# Patient Record
Sex: Female | Born: 1985 | Race: White | Hispanic: No | Marital: Married | Smoking: Never smoker
Health system: Southern US, Community
[De-identification: ages and names within clinical notes are randomized; demographics above are authoritative.]

## PROBLEM LIST (undated history)

## (undated) DIAGNOSIS — E119 Type 2 diabetes mellitus without complications: Secondary | ICD-10-CM

## (undated) DIAGNOSIS — N390 Urinary tract infection, site not specified: Secondary | ICD-10-CM

## (undated) DIAGNOSIS — D649 Anemia, unspecified: Secondary | ICD-10-CM

---

## 2012-09-13 ENCOUNTER — Emergency Department (INDEPENDENT_AMBULATORY_CARE_PROVIDER_SITE_OTHER)
Admission: EM | Admit: 2012-09-13 | Discharge: 2012-09-13 | Disposition: A | Discharge status: Home / Self Care | Payer: Medicaid Other | Source: Home / Self Care

## 2012-09-13 ENCOUNTER — Encounter (HOSPITAL_COMMUNITY): Payer: Self-pay | Admitting: Emergency Medicine

## 2012-09-13 DIAGNOSIS — E119 Type 2 diabetes mellitus without complications: Secondary | ICD-10-CM

## 2012-09-13 HISTORY — DX: Type 2 diabetes mellitus without complications: E11.9

## 2012-09-13 LAB — POCT I-STAT, CHEM 8
Calcium, Ion: 1.26 mmol/L — ABNORMAL HIGH (ref 1.12–1.23)
Chloride: 104 mEq/L (ref 96–112)
Glucose, Bld: 268 mg/dL — ABNORMAL HIGH (ref 70–99)
HCT: 31 % — ABNORMAL LOW (ref 36.0–46.0)
Hemoglobin: 10.5 g/dL — ABNORMAL LOW (ref 12.0–15.0)
Potassium: 4 mEq/L (ref 3.5–5.1)

## 2012-09-13 LAB — POCT URINALYSIS DIP (DEVICE)
Glucose, UA: >=1000 mg/dL — AB
Ketones, ur: NEGATIVE mg/dL
Leukocytes, UA: NEGATIVE
Protein, ur: NEGATIVE mg/dL
Specific Gravity, Urine: 1.02 (ref 1.005–1.030)
Urobilinogen, UA: 0.2 mg/dL (ref 0.0–1.0)

## 2012-09-13 MED ORDER — INSULIN NPH ISOPHANE & REGULAR (70-30) 100 UNIT/ML ~~LOC~~ SUSP: SUBCUTANEOUS | Status: DC

## 2012-09-13 MED ORDER — INSULIN REGULAR HUMAN 100 UNIT/ML IJ SOLN: INTRAMUSCULAR | Status: DC

## 2012-09-13 NOTE — ED Notes (Signed)
Dr. Denyse Amass gave written Rx's for a Glucometer to use TID and another for 30 gage needles and syringes... Placed in bin to be scanned in.

## 2012-09-13 NOTE — ED Provider Notes (Signed)
Taylor Warren is a 27 y.o. female who presents to Urgent Care today for diabetes. Patient is a refugee just arriving to the [place] from [place]. She's had type 1 diabetes for several years now as previously well controlled with Mixtard 30,  15 units AM and Lunch and 20 units PM, and sliding scale regular insulin. She notes that her glucometer [place] at the airport.  She denies any very high glucose levels or hypoglycemic events.  She does however note dysuria present for several months. She had one treatment with antibiotics which did not work.   No fevers or chills nausea vomiting or diarrhea.   Applying for Medicaid. She does not yet have a physician.    PMH reviewed. DM  History  Substance Use Topics  . Smoking status: Never Smoker   . Smokeless tobacco: Not on file  . Alcohol Use: No   ROS as above Medications reviewed. No current facility-administered medications for this encounter.   Current Outpatient Prescriptions  Medication Sig Dispense Refill  . insulin NPH-regular (NOVOLIN 70/30) (70-30) 100 UNIT/ML injection 15 units breakfast and lunch. 20 units [place] in evening.  10 mL  2  . insulin regular (HUMULIN R) 100 units/mL injection Sliding scale [place]  10 mL  1    Exam:  BP 113/76  Pulse 88  Temp(Src) 98.6 F (37 C) (Oral)  Resp 14  SpO2 100% Gen: Well NAD HEENT: EOMI,  MMM Lungs: CTABL Nl WOB Heart: RRR no MRG Abd: NABS, NT, ND Exts: Non edematous BL  LE, warm and well perfused.   Results for orders placed during the hospital encounter of 09/13/12 (from the past 24 hour(s))  POCT I-STAT, CHEM 8     Status: Abnormal   Collection Time    09/13/12 12:10 PM      Result Value Range   Sodium 140  135 - 145 mEq/L   Potassium 4.0  3.5 - 5.1 mEq/L   Chloride 104  96 - 112 mEq/L   BUN 17  6 - 23 mg/dL   Creatinine, Ser 8.65  0.50 - 1.10 mg/dL   Glucose, Bld 784 (*) 70 - 99 mg/dL   Calcium, Ion 6.96 (*) 1.12 - 1.23 mmol/L   TCO2 23  0 - 100 mmol/L   Hemoglobin 10.5 (*) 12.0 - 15.0 g/dL   HCT 29.5 (*) 28.4 - 13.2 %  POCT URINALYSIS DIP (DEVICE)     Status: Abnormal   Collection Time    09/13/12 12:45 PM      Result Value Range   Glucose, UA >=1000 (*) NEGATIVE mg/dL   Bilirubin Urine NEGATIVE  NEGATIVE   Ketones, ur NEGATIVE  NEGATIVE mg/dL   Specific Gravity, Urine 1.020  1.005 - 1.030   Hgb urine dipstick SMALL (*) NEGATIVE   pH 6.0  5.0 - 8.0   Protein, ur NEGATIVE  NEGATIVE mg/dL   Urobilinogen, UA 0.2  0.0 - 1.0 mg/dL   Nitrite NEGATIVE  NEGATIVE   Leukocytes, UA NEGATIVE  NEGATIVE   No results found.  Assessment and Plan: 27 y.o. female with Diabetes.  Controlled type 1 DM.  Nearest analog to Mixtard insulin is Novolin 70/30. Will use same dose but careful monitoring and tapering.  Regular insulin for SSI.  New rx for glucometer and strips, syringe and needles,   F/u with Dr. Letitia Libra ASAP for DM management.   Patient does not have evidence of UTI. Dysuria likely secondary to glycosuria.  Rodolph Bong, MD 09/13/12 [phone]

## 2012-09-13 NOTE — ED Notes (Signed)
Pt is here needing medications on her DM... Reports she just came to [place]; refugee... No PCP... Voices no other concerns...  She is alert w/no signs of acute distress.

## 2012-09-18 ENCOUNTER — Ambulatory Visit (HOSPITAL_COMMUNITY)
Admission: RE | Admit: 2012-09-18 | Discharge: 2012-09-18 | Disposition: A | Discharge status: Home / Self Care | Payer: Medicaid Other | Source: Ambulatory Visit | Attending: Internal Medicine | Admitting: Internal Medicine

## 2012-09-18 ENCOUNTER — Encounter: Payer: Self-pay | Admitting: Family Medicine

## 2012-09-18 ENCOUNTER — Ambulatory Visit: Payer: Medicaid Other | Attending: Family Medicine | Admitting: Internal Medicine

## 2012-09-18 VITALS — BP 113/76 | HR 88

## 2012-09-18 DIAGNOSIS — Z794 Long term (current) use of insulin: Secondary | ICD-10-CM | POA: Insufficient documentation

## 2012-09-18 DIAGNOSIS — M25519 Pain in unspecified shoulder: Secondary | ICD-10-CM | POA: Insufficient documentation

## 2012-09-18 DIAGNOSIS — E109 Type 1 diabetes mellitus without complications: Secondary | ICD-10-CM | POA: Insufficient documentation

## 2012-09-18 DIAGNOSIS — E119 Type 2 diabetes mellitus without complications: Secondary | ICD-10-CM

## 2012-09-18 DIAGNOSIS — M25512 Pain in left shoulder: Secondary | ICD-10-CM

## 2012-09-18 DIAGNOSIS — Z760 Encounter for issue of repeat prescription: Secondary | ICD-10-CM | POA: Insufficient documentation

## 2012-09-18 DIAGNOSIS — N390 Urinary tract infection, site not specified: Secondary | ICD-10-CM | POA: Insufficient documentation

## 2012-09-18 MED ORDER — TRAMADOL HCL 50 MG PO TABS: 50.0000 mg | ORAL_TABLET | Freq: Three times a day (TID) | ORAL | Status: DC | PRN

## 2012-09-18 MED ORDER — NITROFURANTOIN MONOHYD MACRO 100 MG PO CAPS: 100.0000 mg | ORAL_CAPSULE | Freq: Two times a day (BID) | ORAL | Status: DC

## 2012-09-18 MED ORDER — INSULIN REGULAR HUMAN 100 UNIT/ML IJ SOLN: INTRAMUSCULAR | Status: DC

## 2012-09-18 MED ORDER — INSULIN NPH ISOPHANE & REGULAR (70-30) 100 UNIT/ML ~~LOC~~ SUSP: SUBCUTANEOUS | Status: DC

## 2012-09-18 NOTE — Progress Notes (Addendum)
Patient ID: Taylor Warren, female   DOB: [birth year], 27 y.o.   MRN: [id number]  CC: Left shoulder pain, diabetes medication  HPI: 27 year old female with past medical history of type 1 diabetes who presented to our clinic with complaints of left shoulder pain as well as the need for diabetic medications. Patient is taking NovoLog 70/30 insulin mix in addition to sliding scale insulin and requires refills. Besides left shoulder pain which has been present for past few days she has no other complaints. No blurry vision, lightheadedness or headaches. No polydipsia. She does report increased urination and burning sensation on urination but no hematuria. No fever or chills. No abdominal pain, nausea or vomiting.  No Known Allergies Past Medical History  Diagnosis Date  . Diabetes mellitus without complication    No current outpatient prescriptions on file prior to visit.   No current facility-administered medications on file prior to visit.   Family history significant for hypertension in mother  History   Social History  . Marital Status: Married    Spouse Name: N/A    Number of Children: N/A  . Years of Education: N/A   Occupational History  . Not on file.   Social History Main Topics  . Smoking status: Never Smoker   . Smokeless tobacco: Not on file  . Alcohol Use: No  . Drug Use: No  . Sexually Active: Not on file   Other Topics Concern  . Not on file   Social History Narrative  . No narrative on file    Review of Systems  Constitutional: Negative for fever, chills, diaphoresis, activity change, appetite change and fatigue.  HENT: Negative for ear pain, nosebleeds, congestion, facial swelling, rhinorrhea, neck pain, neck stiffness and ear discharge.   Eyes: Negative for pain, discharge, redness, itching and visual disturbance.  Respiratory: Negative for cough, choking, chest tightness, shortness of breath, wheezing and stridor.   Cardiovascular: Negative for chest  pain, palpitations and leg swelling.  Gastrointestinal: Negative for abdominal distention.  Genitourinary: Positive for dysuria, urgency, frequency, negative for hematuria, flank pain, decreased urine volume, difficulty urinating and dyspareunia.  Musculoskeletal: Negative for back pain, joint swelling, arthralgias and gait problem.  positive for left shoulder pain Neurological: Negative for dizziness, tremors, seizures, syncope, facial asymmetry, speech difficulty, weakness, light-headedness, numbness and headaches.  Hematological: Negative for adenopathy. Does not bruise/bleed easily.  Psychiatric/Behavioral: Negative for hallucinations, behavioral problems, confusion, dysphoric mood, decreased concentration and agitation.    Objective:   Filed Vitals:   09/18/12 1205  BP: 113/76  Pulse: 88    Physical Exam  Constitutional: Appears well-developed and well-nourished. No distress.  HENT: Normocephalic. External right and left ear normal. Oropharynx is clear and moist.  Eyes: Conjunctivae and EOM are normal. PERRLA, no scleral icterus.  Neck: Normal ROM. Neck supple. No JVD. No tracheal deviation. No thyromegaly.  CVS: RRR, S1/S2 +, no murmurs, no gallops, no carotid bruit.  Pulmonary: Effort and breath sounds normal, no stridor, rhonchi, wheezes, rales.  Abdominal: Soft. BS +,  no distension, tenderness, rebound or guarding.  Musculoskeletal: Normal range of motion. No edema and no tenderness.  Lymphadenopathy: No lymphadenopathy noted, cervical, inguinal. Neuro: Alert. Normal reflexes, muscle tone coordination. No cranial nerve deficit. Skin: Skin is warm and dry. No rash noted. Not diaphoretic. No erythema. No pallor.  Psychiatric: Normal mood and affect. Behavior, judgment, thought content normal.   Lab Results  Component Value Date   HGB 10.5* 09/13/2012   HCT 31.0* 09/13/2012   Lab  Results  Component Value Date   CREATININE 0.60 09/13/2012   BUN 17 09/13/2012   NA 140  09/13/2012   K 4.0 09/13/2012   CL 104 09/13/2012    No results found for this basename: HGBA1C   Lipid Panel  No results found for this basename: chol, trig, hdl, cholhdl, vldl, ldlcalc       Assessment and plan:   Patient Active Problem List   Diagnosis Date Noted  . Type I (juvenile type) diabetes mellitus without mention of complication, not stated as uncontrolled - A1c during this visit, 9.4 indicating poor glycemic control  - Referral to endocrinology provided  - Continue NovoLog 70/30 mixed 15 units in the morning and 20 units in the evening in addition to sliding scale insulin  09/18/2012  . UTI (urinary tract infection) - Macrobid 100 mg twice daily for 5 days  09/18/2012  . Left shoulder pain - Prescription provided for Ultram when necessary  - We will obtain x-ray of the left shoulder  - Will check urine pregnancy test prior to sending the patient for x-ray (urine test negative for pregnancy) 09/18/2012

## 2012-09-18 NOTE — Patient Instructions (Addendum)
Blood Sugar Monitoring, Adult GLUCOSE METERS FOR SELF-MONITORING OF BLOOD GLUCOSE  It is important to be able to correctly measure your blood sugar (glucose). You can use a blood glucose monitor (a small battery-operated device) to check your glucose level at any time. This allows you and your caregiver to monitor your diabetes and to determine how well your treatment plan is working. The process of monitoring your blood glucose with a glucose meter is called self-monitoring of blood glucose (SMBG). When people with diabetes control their blood sugar, they have better health. To test for glucose with a typical glucose meter, place the disposable strip in the meter. Then place a small sample of blood on the "test strip." The test strip is coated with chemicals that combine with glucose in blood. The meter measures how much glucose is present. The meter displays the glucose level as a number. Several new models can record and store a number of test results. Some models can connect to personal computers to store test results or print them out.  Newer meters are often easier to use than older models. Some meters allow you to get blood from places other than your fingertip. Some new models have automatic timing, error codes, signals, or barcode readers to help with proper adjustment (calibration). Some meters have a large display screen or spoken instructions for people with visual impairments.  INSTRUCTIONS FOR USING GLUCOSE METERS  Wash your hands with soap and warm water, or clean the area with alcohol. Dry your hands completely.  Prick the side of your fingertip with a lancet (a sharp-pointed tool used by hand).  Hold the hand down and gently milk the finger until a small drop of blood appears. Catch the blood with the test strip.  Follow the instructions for inserting the test strip and using the SMBG meter. Most meters require the meter to be turned on and the test strip to be inserted before applying  the blood sample.  Record the test result.  Read the instructions carefully for both the meter and the test strips that go with it. Meter instructions are found in the user manual. Keep this manual to help you solve any problems that may arise. Many meters use "error codes" when there is a problem with the meter, the test strip, or the blood sample on the strip. You will need the manual to understand these error codes and fix the problem.  New devices are available such as laser lancets and meters that can test blood taken from "alternative sites" of the body, other than fingertips. However, you should use standard fingertip testing if your glucose changes rapidly. Also, use standard testing if:  You have eaten, exercised, or taken insulin in the past 2 hours.  You think your glucose is low.  You tend to not feel symptoms of low blood glucose (hypoglycemia).  You are ill or under stress.  Clean the meter as directed by the manufacturer.  Test the meter for accuracy as directed by the manufacturer.  Take your meter with you to your caregiver's office. This way, you can test your glucose in front of your caregiver to make sure you are using the meter correctly. Your caregiver can also take a sample of blood to test using a routine lab method. If values on the glucose meter are close to the lab results, you and your caregiver will see that your meter is working well and you are using good technique. Your caregiver will advise you about what   to do if the results do not match. FREQUENCY OF TESTING  Your caregiver will tell you how often you should check your blood glucose. This will depend on your type of diabetes, your current level of diabetes control, and your types of medicines. The following are general guidelines, but your care plan may be different. Record all your readings and the time of day you took them for review with your caregiver.   Diabetes type 1.  When you are using insulin  with good diabetic control (either multiple daily injections or via a pump), you should check your glucose 4 times a day.  If your diabetes is not well controlled, you may need to monitor more frequently, including before meals and 2 hours after meals, at bedtime, and occasionally between 2 a.m. and 3 a.m.  You should always check your glucose before a dose of insulin or before changing the rate on your insulin pump.  Diabetes type 2.  Guidelines for SMBG in diabetes type 2 are not as well defined.  If you are on insulin, follow the guidelines above.  If you are on medicines, but not insulin, and your glucose is not well controlled, you should test at least twice daily.  If you are not on insulin, and your diabetes is controlled with medicines or diet alone, you should test at least once daily, usually before breakfast.  A weekly profile will help your caregiver advise you on your care plan. The week before your visit, check your glucose before a meal and 2 hours after a meal at least daily. You may want to test before and after a different meal each day so you and your caregiver can tell how well controlled your blood sugars are throughout the course of a 24 hour period.  Gestational diabetes (diabetes during pregnancy).  Frequent testing is often necessary. Accurate timing is important.  If you are not on insulin, check your glucose 4 times a day. Check it before breakfast and 1 hour after the start of each meal.  If you are on insulin, check your glucose 6 times a day. Check it before each meal and 1 hour after the first bite of each meal.  General guidelines.  More frequent testing is required at the start of insulin treatment. Your caregiver will instruct you.  Test your glucose any time you suspect you have low blood sugar (hypoglycemia).  You should test more often when you change medicines, when you have unusual stress or illness, or in other unusual circumstances. OTHER  THINGS TO KNOW ABOUT GLUCOSE METERS  Measurement Range. Most glucose meters are able to read glucose levels over a broad range of values from as low as 0 to as high as 600 mg/dL. If you get an extremely high or low reading from your meter, you should first confirm it with another reading. Report very high or very low readings to your caregiver.  Whole Blood Glucose versus Plasma Glucose. Some older home glucose meters measure glucose in your whole blood. In a lab or when using some newer home glucose meters, the glucose is measured in your plasma (one component of blood). The difference can be important. It is important for you and your caregiver to know whether your meter gives its results as "whole blood equivalent" or "plasma equivalent."  Display of High and Low Glucose Values. Part of learning how to operate a meter is understanding what the meter results mean. Know how high and low glucose concentrations are displayed  on your meter.  Factors that Affect Glucose Meter Performance. The accuracy of your test results depends on many factors and varies depending on the brand and type of meter. These factors include:  Low red blood cell count (anemia).  Substances in your blood (such as uric acid, vitamin C, and others).  Environmental factors (temperature, humidity, altitude).  Name-brand versus generic test strips.  Calibration. Make sure your meter is set up properly. It is a good idea to do a calibration test with a control solution recommended by the manufacturer of your meter whenever you begin using a fresh bottle of test strips. This will help verify the accuracy of your meter.  Improperly stored, expired, or defective test strips. Keep your strips in a dry place with the lid on.  Soiled meter.  Inadequate blood sample. NEW TECHNOLOGIES FOR GLUCOSE TESTING Alternative site testing Some glucose meters allow testing blood from alternative sites. These include the:  Upper  arm.  Forearm.  Base of the thumb.  Thigh. Sampling blood from alternative sites may be desirable. However, it may have some limitations. Blood in the fingertips show changes in glucose levels more quickly than blood in other parts of the body. This means that alternative site test results may be different from fingertip test results, not because of the meter's ability to test accurately, but because the actual glucose concentration can be different.  Continuous Glucose Monitoring Devices to measure your blood glucose continuously are available, and others are in development. These methods can be more expensive than self-monitoring with a glucose meter. However, it is uncertain how effective and reliable these devices are. Your caregiver will advise you if this approach makes sense for you. IF BLOOD SUGARS ARE CONTROLLED, PEOPLE WITH DIABETES REMAIN HEALTHIER.  SMBG is an important part of the treatment plan of patients with diabetes mellitus. Below are reasons for using SMBG:   It confirms that your glucose is at a specific, healthy level.  It detects hypoglycemia and severe hyperglycemia.  It allows you and your caregiver to make adjustments in response to changes in lifestyle for individuals requiring medicine.  It determines the need for starting insulin therapy in temporary diabetes that happens during pregnancy (gestational diabetes). Document Released: 02/17/2003 Document Revised: 05/09/2011 Document Reviewed: 06/10/2010 [facility] Patient Information 2014 [place], [place].  Diabetes, Frequently Asked Questions WHAT IS DIABETES? Most of the food we eat is turned into glucose (sugar). Our bodies use it for energy. The pancreas makes a hormone called insulin. It helps glucose get into the cells of our bodies. When you have diabetes, your body either does not make enough insulin or cannot use its own insulin as well as it should. This causes sugars to build up in your blood. WHAT ARE THE  SYMPTOMS OF DIABETES?  Frequent urination.  Excessive thirst.  Unexplained weight loss.  Extreme hunger.  Blurred vision.  Tingling or numbness in hands or feet.  Feeling very tired much of the time.  Dry, itchy skin.  Sores that are slow to heal.  Yeast infections. WHAT ARE THE TYPES OF DIABETES? Type 1 Diabetes   About 10% of affected people have this type.  Usually occurs before the age of 74.  Usually occurs in thin to normal weight people. Type 2 Diabetes  About 90% of affected people have this type.  Usually occurs after the age of 36.  Usually occurs in overweight people.  More likely to have:  A family history of diabetes.  A history of  diabetes during pregnancy (gestational diabetes).  High blood pressure.  High cholesterol and triglycerides. Gestational Diabetes  Occurs in about 4% of pregnancies.  Usually goes away after the baby is born.  More likely to occur in women with:  Family history of diabetes.  Previous gestational diabetes.  Obese.  Over 34 years old. WHAT IS PRE-DIABETES? Pre-diabetes means your blood glucose is higher than normal, but lower than the diabetes range. It also means you are at risk of getting type 2 diabetes and heart disease. If you are told you have pre-diabetes, have your blood glucose checked again in 1 to 2 years. WHAT IS THE TREATMENT FOR DIABETES? Treatment is aimed at keeping blood glucose near normal levels at all times. Learning how to manage this yourself is important in treating diabetes. Depending on the type of diabetes you have, your treatment will include one or more of the following:  Monitoring your blood glucose.  Meal planning.  Exercise.  Oral medicine (pills) or insulin. CAN DIABETES BE PREVENTED? With type 1 diabetes, prevention is more difficult, because the triggers that cause it are not yet known. With type 2 diabetes, prevention is more likely, with lifestyle changes:  Maintain  a healthy weight.  Eat healthy.  Exercise. IS THERE A CURE FOR DIABETES? No, there is no cure for diabetes. There is a lot of research going on that is looking for a cure, and progress is being made. Diabetes can be treated and controlled. People with diabetes can manage their diabetes and lead normal, active lives. SHOULD I BE TESTED FOR DIABETES? If you are at least 26 years old, you should be tested for diabetes. You should be tested again every 3 years. If you are 45 or older and overweight, you may want to get tested more often. If you are younger than 45, overweight, and have one or more of the following risk factors, you should be tested:  Family history of diabetes.  Inactive lifestyle.  High blood pressure. WHAT ARE SOME OTHER SOURCES FOR INFORMATION ON DIABETES? The following organizations may help in your search for more information on diabetes: National Diabetes Education Program (NDEP) Internet: [employer] American Diabetes Association Internet: http://www.diabetes.org  Juvenile Diabetes Foundation International Internet: [employer] Document Released: 02/17/2003 Document Revised: 05/09/2011 Document Reviewed: 12/12/2008 [employer] Patient Information 2014 [employer], [place].  1800 Calorie Diet for Diabetes Meal Planning The 1800 calorie diet is designed for eating up to 1800 calories each day. Following this diet and making healthy meal choices can help improve overall health. This diet controls blood sugar (glucose) levels and can also help lower blood pressure and cholesterol. SERVING SIZES Measuring foods and serving sizes helps to make sure you are getting the right amount of food. The list below tells how big or small some common serving sizes are:  1 oz.........4 stacked dice.  3 oz........Marland KitchenDeck of cards.  1 tsp.......Marland KitchenTip of little finger.  1 tbs......Marland KitchenMarland KitchenThumb.  2 tbs.......Marland KitchenGolf ball.   cup......Marland KitchenHalf of a fist.  1 cup.......Marland KitchenA  fist. GUIDELINES FOR CHOOSING FOODS The goal of this diet is to eat a variety of foods and limit calories to 1800 each day. This can be done by choosing foods that are low in calories and fat. The diet also suggests eating small amounts of food frequently. Doing this helps control your blood glucose levels so they do not get too high or too low. Each meal or snack may include a protein food source to help you feel more satisfied and to stabilize your  blood glucose. Try to eat about the same amount of food around the same time each day. This includes weekend days, travel days, and days off work. Space your meals about 4 to 5 hours apart and add a snack between them if you wish.  For example, a daily food plan could include breakfast, a morning snack, lunch, dinner, and an evening snack. Healthy meals and snacks include whole grains, vegetables, fruits, lean meats, poultry, fish, and dairy products. As you plan your meals, select a variety of foods. Choose from the bread and starch, vegetable, fruit, dairy, and meat/protein groups. Examples of foods from each group and their suggested serving sizes are listed below. Use measuring cups and spoons to become familiar with what a healthy portion looks like. Bread and Starch Each serving equals 15 grams of carbohydrates.  1 slice bread.   bagel.   cup cold cereal (unsweetened).   cup hot cereal or mashed potatoes.  1 small potato (size of a computer mouse).   cup cooked pasta or rice.   English muffin.  1 cup broth-based soup.  3 cups of popcorn.  4 to 6 whole-wheat crackers.   cup cooked beans, peas, or corn. Vegetable Each serving equals 5 grams of carbohydrates.   cup cooked vegetables.  1 cup raw vegetables.   cup tomato or vegetable juice. Fruit Each serving equals 15 grams of carbohydrates.  1 small apple or orange.  1 cup watermelon or strawberries.   cup applesauce (no sugar added).  2 tbs raisins.    banana.   cup canned fruit, packed in water, its own juice, or sweetened with a sugar substitute.   cup unsweetened fruit juice. Dairy Each serving equals 12 to 15 grams of carbohydrates.  1 cup fat-free milk.  6 oz artificially sweetened yogurt or plain yogurt.  1 cup low-fat buttermilk.  1 cup soy milk.  1 cup almond milk. Meat/Protein  1 large egg.  2 to 3 oz meat, poultry, or fish.   cup low-fat cottage cheese.  1 tbs peanut butter.  1 oz low-fat cheese.   cup tuna in water.   cup tofu. Fat  1 tsp oil.  1 tsp trans-fat-free margarine.  1 tsp butter.  1 tsp mayonnaise.  2 tbs avocado.  1 tbs salad dressing.  1 tbs cream cheese.  2 tbs sour cream. SAMPLE 1800 CALORIE DIET PLAN Breakfast   cup unsweetened cereal (1 carb serving).  1 cup fat-free milk (1 carb serving).  1 slice whole-wheat toast (1 carb serving).   small banana (1 carb serving).  1 scrambled egg.  1 tsp trans-fat-free margarine. Lunch  Tuna sandwich.  2 slices whole-wheat bread (2 carb servings).   cup canned tuna in water, drained.  1 tbs reduced fat mayonnaise.  1 stalk celery, chopped.  2 slices tomato.  1 lettuce leaf.  1 cup carrot sticks.  24 to 30 seedless grapes (2 carb servings).  6 oz light yogurt (1 carb serving). Afternoon Snack  3 graham cracker squares (1 carb serving).  Fat-free milk, 1 cup (1 carb serving).  1 tbs peanut butter. Dinner  3 oz salmon, broiled with 1 tsp oil.  1 cup mashed potatoes (2 carb servings) with 1 tsp trans-fat-free margarine.  1 cup fresh or frozen green beans.  1 cup steamed asparagus.  1 cup fat-free milk (1 carb serving). Evening Snack  3 cups air-popped popcorn (1 carb serving).  2 tbs parmesan cheese sprinkled on top. MEAL PLAN Use  this worksheet to help you make a daily meal plan based on the 1800 calorie diet suggestions. If you are using this plan to help you control your blood glucose,  you may interchange carbohydrate-containing foods (dairy, starches, and fruits). Select a variety of fresh foods of varying colors and flavors. The total amount of carbohydrate in your meals or snacks is more important than making sure you include all of the food groups every time you eat. Choose from the following foods to build your day's meals:  8 Starches.  4 Vegetables.  3 Fruits.  2 Dairy.  6 to 7 oz Meat/Protein.  Up to 4 Fats. Your dietician can use this worksheet to help you decide how many servings and which types of foods are right for you. BREAKFAST Food Group and Servings / Food Choice Starch ________________________________________________________ Dairy _________________________________________________________ Fruit _________________________________________________________ Meat/Protein __________________________________________________ Fat ___________________________________________________________ LUNCH Food Group and Servings / Food Choice Starch ________________________________________________________ Meat/Protein __________________________________________________ Vegetable _____________________________________________________ Fruit _________________________________________________________ Dairy _________________________________________________________ Fat ___________________________________________________________ Aura Fey Food Group and Servings / Food Choice Starch ________________________________________________________ Meat/Protein __________________________________________________ Fruit __________________________________________________________ Dairy _________________________________________________________ Laural Golden Food Group and Servings / Food Choice Starch _________________________________________________________ Meat/Protein ___________________________________________________ Dairy  __________________________________________________________ Vegetable ______________________________________________________ Fruit ___________________________________________________________ Fat ____________________________________________________________ Lollie Sails Food Group and Servings / Food Choice Fruit __________________________________________________________ Meat/Protein ___________________________________________________ Dairy __________________________________________________________ Starch _________________________________________________________ DAILY TOTALS Starch ____________________________ Vegetable _________________________ Fruit _____________________________ Dairy _____________________________ Meat/Protein______________________ Fat _______________________________ Document Released: 09/06/2004 Document Revised: 05/09/2011 Document Reviewed: 12/31/2010 [employer] Patient Information 2014 [employer]. Blood Sugar Monitoring, Adult GLUCOSE METERS FOR SELF-MONITORING OF BLOOD GLUCOSE  It is important to be able to correctly measure your blood sugar (glucose). You can use a blood glucose monitor (a small battery-operated device) to check your glucose level at any time. This allows you and your caregiver to monitor your diabetes and to determine how well your treatment plan is working. The process of monitoring your blood glucose with a glucose meter is called self-monitoring of blood glucose (SMBG). When people with diabetes control their blood sugar, they have better health. To test for glucose with a typical glucose meter, place the disposable strip in the meter. Then place a small sample of blood on the "test strip." The test strip is coated with chemicals that combine with glucose in blood. The meter measures how much glucose is present. The meter displays the glucose level as a number. Several new models can record and store a number of test results. Some models can  connect to personal computers to store test results or print them out.  Newer meters are often easier to use than older models. Some meters allow you to get blood from places other than your fingertip. Some new models have automatic timing, error codes, signals, or barcode readers to help with proper adjustment (calibration). Some meters have a large display screen or spoken instructions for people with visual impairments.  INSTRUCTIONS FOR USING GLUCOSE METERS  Wash your hands with soap and warm water, or clean the area with alcohol. Dry your hands completely.  Prick the side of your fingertip with a lancet (a sharp-pointed tool used by hand).  Hold the hand down and gently milk the finger until a small drop of blood appears. Catch the blood with the test strip.  Follow the instructions for inserting the test strip and using the SMBG meter. Most meters require the meter to be turned on and the test strip to be inserted before applying the blood sample.  Record the test result.  Read the instructions carefully  for both the meter and the test strips that go with it. Meter instructions are found in the user manual. Keep this manual to help you solve any problems that may arise. Many meters use "error codes" when there is a problem with the meter, the test strip, or the blood sample on the strip. You will need the manual to understand these error codes and fix the problem.  New devices are available such as laser lancets and meters that can test blood taken from "alternative sites" of the body, other than fingertips. However, you should use standard fingertip testing if your glucose changes rapidly. Also, use standard testing if:  You have eaten, exercised, or taken insulin in the past 2 hours.  You think your glucose is low.  You tend to not feel symptoms of low blood glucose (hypoglycemia).  You are ill or under stress.  Clean the meter as directed by the manufacturer.  Test the meter for  accuracy as directed by the manufacturer.  Take your meter with you to your caregiver's office. This way, you can test your glucose in front of your caregiver to make sure you are using the meter correctly. Your caregiver can also take a sample of blood to test using a routine lab method. If values on the glucose meter are close to the lab results, you and your caregiver will see that your meter is working well and you are using good technique. Your caregiver will advise you about what to do if the results do not match. FREQUENCY OF TESTING  Your caregiver will tell you how often you should check your blood glucose. This will depend on your type of diabetes, your current level of diabetes control, and your types of medicines. The following are general guidelines, but your care plan may be different. Record all your readings and the time of day you took them for review with your caregiver.   Diabetes type 1.  When you are using insulin with good diabetic control (either multiple daily injections or via a pump), you should check your glucose 4 times a day.  If your diabetes is not well controlled, you may need to monitor more frequently, including before meals and 2 hours after meals, at bedtime, and occasionally between 2 a.m. and 3 a.m.  You should always check your glucose before a dose of insulin or before changing the rate on your insulin pump.  Diabetes type 2.  Guidelines for SMBG in diabetes type 2 are not as well defined.  If you are on insulin, follow the guidelines above.  If you are on medicines, but not insulin, and your glucose is not well controlled, you should test at least twice daily.  If you are not on insulin, and your diabetes is controlled with medicines or diet alone, you should test at least once daily, usually before breakfast.  A weekly profile will help your caregiver advise you on your care plan. The week before your visit, check your glucose before a meal and 2  hours after a meal at least daily. You may want to test before and after a different meal each day so you and your caregiver can tell how well controlled your blood sugars are throughout the course of a 24 hour period.  Gestational diabetes (diabetes during pregnancy).  Frequent testing is often necessary. Accurate timing is important.  If you are not on insulin, check your glucose 4 times a day. Check it before breakfast and 1 hour after the  start of each meal.  If you are on insulin, check your glucose 6 times a day. Check it before each meal and 1 hour after the first bite of each meal.  General guidelines.  More frequent testing is required at the start of insulin treatment. Your caregiver will instruct you.  Test your glucose any time you suspect you have low blood sugar (hypoglycemia).  You should test more often when you change medicines, when you have unusual stress or illness, or in other unusual circumstances. OTHER THINGS TO KNOW ABOUT GLUCOSE METERS  Measurement Range. Most glucose meters are able to read glucose levels over a broad range of values from as low as 0 to as high as 600 mg/dL. If you get an extremely high or low reading from your meter, you should first confirm it with another reading. Report very high or very low readings to your caregiver.  Whole Blood Glucose versus Plasma Glucose. Some older home glucose meters measure glucose in your whole blood. In a lab or when using some newer home glucose meters, the glucose is measured in your plasma (one component of blood). The difference can be important. It is important for you and your caregiver to know whether your meter gives its results as "whole blood equivalent" or "plasma equivalent."  Display of High and Low Glucose Values. Part of learning how to operate a meter is understanding what the meter results mean. Know how high and low glucose concentrations are displayed on your meter.  Factors that Affect Glucose  Meter Performance. The accuracy of your test results depends on many factors and varies depending on the brand and type of meter. These factors include:  Low red blood cell count (anemia).  Substances in your blood (such as uric acid, vitamin C, and others).  Environmental factors (temperature, humidity, altitude).  Name-brand versus generic test strips.  Calibration. Make sure your meter is set up properly. It is a good idea to do a calibration test with a control solution recommended by the manufacturer of your meter whenever you begin using a fresh bottle of test strips. This will help verify the accuracy of your meter.  Improperly stored, expired, or defective test strips. Keep your strips in a dry place with the lid on.  Soiled meter.  Inadequate blood sample. NEW TECHNOLOGIES FOR GLUCOSE TESTING Alternative site testing Some glucose meters allow testing blood from alternative sites. These include the:  Upper arm.  Forearm.  Base of the thumb.  Thigh. Sampling blood from alternative sites may be desirable. However, it may have some limitations. Blood in the fingertips show changes in glucose levels more quickly than blood in other parts of the body. This means that alternative site test results may be different from fingertip test results, not because of the meter's ability to test accurately, but because the actual glucose concentration can be different.  Continuous Glucose Monitoring Devices to measure your blood glucose continuously are available, and others are in development. These methods can be more expensive than self-monitoring with a glucose meter. However, it is uncertain how effective and reliable these devices are. Your caregiver will advise you if this approach makes sense for you. IF BLOOD SUGARS ARE CONTROLLED, PEOPLE WITH DIABETES REMAIN HEALTHIER.  SMBG is an important part of the treatment plan of patients with diabetes mellitus. Below are reasons for using  SMBG:   It confirms that your glucose is at a specific, healthy level.  It detects hypoglycemia and severe hyperglycemia.  It  allows you and your caregiver to make adjustments in response to changes in lifestyle for individuals requiring medicine.  It determines the need for starting insulin therapy in temporary diabetes that happens during pregnancy (gestational diabetes). Document Released: 02/17/2003 Document Revised: 05/09/2011 Document Reviewed: 06/10/2010 [facility] Patient Information 2014 [place], [place].

## 2012-09-18 NOTE — Progress Notes (Signed)
PT HERE TO ESTABLISH CARE. ARABIC LANGUAGE. DIABETES TYPE 1 DIAG 2006 IN [place].SEEN 2 DYS AGO IN [facility] FOR HYPERGLYCEMIA AND PRESCRIBED INSULIN. FASTING [facility] YESTERDAY 300.C/O PELVIC PAIN RADIATING TO LEFT SHOULDER BLADE X 2 WEEKS. CURRENTLY ON MENSTRUAL.DENIES FEVER,CHILLS,N/V.ONLY BEEN IN [place] X 6 DYS AGO. NEED DENTAL REFERRAL.VSS.MOTHER WITH PT/TRANSLATOR

## 2012-09-19 ENCOUNTER — Telehealth: Payer: Self-pay | Admitting: Family Medicine

## 2012-09-19 NOTE — Telephone Encounter (Signed)
Pt received call but does not speak english

## 2012-09-20 ENCOUNTER — Telehealth: Payer: Self-pay | Admitting: *Deleted

## 2012-09-20 NOTE — Telephone Encounter (Signed)
09/20/12 Patient requesting X-Ray results and lab results please review and make recommendation. P.Riely Oetken,RN BSN [facility]

## 2012-09-20 NOTE — Telephone Encounter (Signed)
Please inform patient that xray of shoulder results came back normal.  No abnormalities were seen.

## 2012-09-20 NOTE — Telephone Encounter (Signed)
Pt's medicaid not active yet, but in the process. Caseworker calling to confirm information about referral for pt.  Please f/u with caseworker [phone].

## 2012-09-21 ENCOUNTER — Telehealth: Payer: Self-pay | Admitting: *Deleted

## 2012-09-21 NOTE — Telephone Encounter (Signed)
09/21/12 Patient made aware that Xray results came back normal. No abnormalities. P,Labrian Torregrossa,RN BSN [facility]

## 2012-09-25 ENCOUNTER — Ambulatory Visit (HOSPITAL_COMMUNITY)
Admission: RE | Admit: 2012-09-25 | Discharge: 2012-09-25 | Disposition: A | Discharge status: Home / Self Care | Payer: Medicaid Other | Source: Ambulatory Visit | Attending: Family Medicine | Admitting: Family Medicine

## 2012-09-25 ENCOUNTER — Encounter: Payer: Self-pay | Admitting: Family Medicine

## 2012-09-25 ENCOUNTER — Ambulatory Visit: Payer: Medicaid Other | Attending: Family Medicine | Admitting: Family Medicine

## 2012-09-25 VITALS — BP 103/70 | HR 82 | Temp 98.5°F | Ht 62.0 in | Wt 116.4 lb

## 2012-09-25 DIAGNOSIS — R109 Unspecified abdominal pain: Secondary | ICD-10-CM

## 2012-09-25 DIAGNOSIS — M549 Dorsalgia, unspecified: Secondary | ICD-10-CM | POA: Insufficient documentation

## 2012-09-25 DIAGNOSIS — M542 Cervicalgia: Secondary | ICD-10-CM | POA: Insufficient documentation

## 2012-09-25 DIAGNOSIS — M4120 Other idiopathic scoliosis, site unspecified: Secondary | ICD-10-CM | POA: Insufficient documentation

## 2012-09-25 DIAGNOSIS — M546 Pain in thoracic spine: Secondary | ICD-10-CM | POA: Insufficient documentation

## 2012-09-25 DIAGNOSIS — N390 Urinary tract infection, site not specified: Secondary | ICD-10-CM

## 2012-09-25 DIAGNOSIS — E109 Type 1 diabetes mellitus without complications: Secondary | ICD-10-CM

## 2012-09-25 DIAGNOSIS — R7309 Other abnormal glucose: Secondary | ICD-10-CM

## 2012-09-25 DIAGNOSIS — R739 Hyperglycemia, unspecified: Secondary | ICD-10-CM

## 2012-09-25 NOTE — Patient Instructions (Addendum)
Go to [facility] at [address] to get xrays done Insulin pump to be discussed with endocrinologist   Hypoglycemia (Low Blood Sugar) Hypoglycemia is when the glucose (sugar) in your blood is too low. Hypoglycemia can happen for many reasons. It can happen to people with or without diabetes. Hypoglycemia can develop quickly and can be a medical emergency.  CAUSES  Having hypoglycemia does not mean that you will develop diabetes. Different causes include:  Missed or delayed meals or not enough carbohydrates eaten.  Medication overdose. This could be by accident or deliberate. If by accident, your medication may need to be adjusted or changed.  Exercise or increased activity without adjustments in carbohydrates or medications.  A nerve disorder that affects body functions like your heart rate, blood pressure and digestion (autonomic neuropathy).  A condition where the stomach muscles do not function properly (gastroparesis). Therefore, medications may not absorb properly.  The inability to recognize the signs of hypoglycemia (hypoglycemic unawareness).  Absorption of insulin  may be altered.  Alcohol consumption.  Pregnancy/menstrual cycles/postpartum. This may be due to hormones.  Certain kinds of tumors. This is very rare. SYMPTOMS   Sweating.  Hunger.  Dizziness.  Blurred vision.  Drowsiness.  Weakness.  Headache.  Rapid heart beat.  Shakiness.  Nervousness. DIAGNOSIS  Diagnosis is made by monitoring blood glucose in one or all of the following ways:  Fingerstick blood glucose monitoring.  Laboratory results. TREATMENT  If you think your blood glucose is low:  Check your blood glucose, if possible. If it is less than 70 mg/dl, take one of the following:  3-4 glucose tablets.   cup juice (prefer clear like apple).   cup "regular" soda pop.  1 cup milk.  -1 tube of glucose gel.  5-6 hard candies.  Do not over treat because your  blood glucose (sugar) will only go too high.  Wait 15 minutes and recheck your blood glucose. If it is still less than 70 mg/dl (or below your target range), repeat treatment.  Eat a snack if it is more than one hour until your next meal. Sometimes, your blood glucose may go so low that you are unable to treat yourself. You may need someone to help you. You may even pass out or be unable to swallow. This may require you to get an injection of glucagon, which raises the blood glucose. HOME CARE INSTRUCTIONS  Check blood glucose as recommended by your caregiver.  Take medication as prescribed by your caregiver.  Follow your meal plan. Do not skip meals. Eat on time.  If you are going to drink alcohol, drink it only with meals.  Check your blood glucose before driving.  Check your blood glucose before and after exercise. If you exercise longer or different than usual, be sure to check blood glucose more frequently.  Always carry treatment with you. Glucose tablets are the easiest to carry.  Always wear medical alert jewelry or carry some form of identification that states that you have diabetes. This will alert people that you have diabetes. If you have hypoglycemia, they will have a better idea on what to do. SEEK MEDICAL CARE IF:   You are having problems keeping your blood sugar at target range.  You are having frequent episodes of hypoglycemia.  You feel you might be having side effects from your medicines.  You have symptoms of an illness that is not improving after 3-4 days.  You notice a change in vision or a  new problem with your vision. SEEK IMMEDIATE MEDICAL CARE IF:   You are a family member or friend of a person whose blood glucose goes below 70 mg/dl and is accompanied by:  Confusion.  A change in mental status.  The inability to swallow.  Passing out. Document Released: 02/14/2005 Document Revised: 05/09/2011 Document Reviewed: 06/13/2011 [facility] Patient  Information 2014 [place], [place]. Blood Sugar Monitoring, Adult GLUCOSE METERS FOR SELF-MONITORING OF BLOOD GLUCOSE  It is important to be able to correctly measure your blood sugar (glucose). You can use a blood glucose monitor (a small battery-operated device) to check your glucose level at any time. This allows you and your caregiver to monitor your diabetes and to determine how well your treatment plan is working. The process of monitoring your blood glucose with a glucose meter is called self-monitoring of blood glucose (SMBG). When people with diabetes control their blood sugar, they have better health. To test for glucose with a typical glucose meter, place the disposable strip in the meter. Then place a small sample of blood on the "test strip." The test strip is coated with chemicals that combine with glucose in blood. The meter measures how much glucose is present. The meter displays the glucose level as a number. Several new models can record and store a number of test results. Some models can connect to personal computers to store test results or print them out.  Newer meters are often easier to use than older models. Some meters allow you to get blood from places other than your fingertip. Some new models have automatic timing, error codes, signals, or barcode readers to help with proper adjustment (calibration). Some meters have a large display screen or spoken instructions for people with visual impairments.  INSTRUCTIONS FOR USING GLUCOSE METERS  Wash your hands with soap and warm water, or clean the area with alcohol. Dry your hands completely.  Prick the side of your fingertip with a lancet (a sharp-pointed tool used by hand).  Hold the hand down and gently milk the finger until a small drop of blood appears. Catch the blood with the test strip.  Follow the instructions for inserting the test strip and using the SMBG meter. Most meters require the meter to be turned on and the test  strip to be inserted before applying the blood sample.  Record the test result.  Read the instructions carefully for both the meter and the test strips that go with it. Meter instructions are found in the user manual. Keep this manual to help you solve any problems that may arise. Many meters use "error codes" when there is a problem with the meter, the test strip, or the blood sample on the strip. You will need the manual to understand these error codes and fix the problem.  New devices are available such as laser lancets and meters that can test blood taken from "alternative sites" of the body, other than fingertips. However, you should use standard fingertip testing if your glucose changes rapidly. Also, use standard testing if:  You have eaten, exercised, or taken insulin in the past 2 hours.  You think your glucose is low.  You tend to not feel symptoms of low blood glucose (hypoglycemia).  You are ill or under stress.  Clean the meter as directed by the manufacturer.  Test the meter for accuracy as directed by the manufacturer.  Take your meter with you to your caregiver's office. This way, you can test your glucose in front  of your caregiver to make sure you are using the meter correctly. Your caregiver can also take a sample of blood to test using a routine lab method. If values on the glucose meter are close to the lab results, you and your caregiver will see that your meter is working well and you are using good technique. Your caregiver will advise you about what to do if the results do not match. FREQUENCY OF TESTING  Your caregiver will tell you how often you should check your blood glucose. This will depend on your type of diabetes, your current level of diabetes control, and your types of medicines. The following are general guidelines, but your care plan may be different. Record all your readings and the time of day you took them for review with your caregiver.   Diabetes type  1.  When you are using insulin with good diabetic control (either multiple daily injections or via a pump), you should check your glucose 4 times a day.  If your diabetes is not well controlled, you may need to monitor more frequently, including before meals and 2 hours after meals, at bedtime, and occasionally between 2 a.m. and 3 a.m.  You should always check your glucose before a dose of insulin or before changing the rate on your insulin pump.  Diabetes type 2.  Guidelines for SMBG in diabetes type 2 are not as well defined.  If you are on insulin, follow the guidelines above.  If you are on medicines, but not insulin, and your glucose is not well controlled, you should test at least twice daily.  If you are not on insulin, and your diabetes is controlled with medicines or diet alone, you should test at least once daily, usually before breakfast.  A weekly profile will help your caregiver advise you on your care plan. The week before your visit, check your glucose before a meal and 2 hours after a meal at least daily. You may want to test before and after a different meal each day so you and your caregiver can tell how well controlled your blood sugars are throughout the course of a 24 hour period.  Gestational diabetes (diabetes during pregnancy).  Frequent testing is often necessary. Accurate timing is important.  If you are not on insulin, check your glucose 4 times a day. Check it before breakfast and 1 hour after the start of each meal.  If you are on insulin, check your glucose 6 times a day. Check it before each meal and 1 hour after the first bite of each meal.  General guidelines.  More frequent testing is required at the start of insulin treatment. Your caregiver will instruct you.  Test your glucose any time you suspect you have low blood sugar (hypoglycemia).  You should test more often when you change medicines, when you have unusual stress or illness, or in other  unusual circumstances. OTHER THINGS TO KNOW ABOUT GLUCOSE METERS  Measurement Range. Most glucose meters are able to read glucose levels over a broad range of values from as low as 0 to as high as 600 mg/dL. If you get an extremely high or low reading from your meter, you should first confirm it with another reading. Report very high or very low readings to your caregiver.  Whole Blood Glucose versus Plasma Glucose. Some older home glucose meters measure glucose in your whole blood. In a lab or when using some newer home glucose meters, the glucose is measured in your  plasma (one component of blood). The difference can be important. It is important for you and your caregiver to know whether your meter gives its results as "whole blood equivalent" or "plasma equivalent."  Display of High and Low Glucose Values. Part of learning how to operate a meter is understanding what the meter results mean. Know how high and low glucose concentrations are displayed on your meter.  Factors that Affect Glucose Meter Performance. The accuracy of your test results depends on many factors and varies depending on the brand and type of meter. These factors include:  Low red blood cell count (anemia).  Substances in your blood (such as uric acid, vitamin C, and others).  Environmental factors (temperature, humidity, altitude).  Name-brand versus generic test strips.  Calibration. Make sure your meter is set up properly. It is a good idea to do a calibration test with a control solution recommended by the manufacturer of your meter whenever you begin using a fresh bottle of test strips. This will help verify the accuracy of your meter.  Improperly stored, expired, or defective test strips. Keep your strips in a dry place with the lid on.  Soiled meter.  Inadequate blood sample. NEW TECHNOLOGIES FOR GLUCOSE TESTING Alternative site testing Some glucose meters allow testing blood from alternative sites. These  include the:  Upper arm.  Forearm.  Base of the thumb.  Thigh. Sampling blood from alternative sites may be desirable. However, it may have some limitations. Blood in the fingertips show changes in glucose levels more quickly than blood in other parts of the body. This means that alternative site test results may be different from fingertip test results, not because of the meter's ability to test accurately, but because the actual glucose concentration can be different.  Continuous Glucose Monitoring Devices to measure your blood glucose continuously are available, and others are in development. These methods can be more expensive than self-monitoring with a glucose meter. However, it is uncertain how effective and reliable these devices are. Your caregiver will advise you if this approach makes sense for you. IF BLOOD SUGARS ARE CONTROLLED, PEOPLE WITH DIABETES REMAIN HEALTHIER.  SMBG is an important part of the treatment plan of patients with diabetes mellitus. Below are reasons for using SMBG:   It confirms that your glucose is at a specific, healthy level.  It detects hypoglycemia and severe hyperglycemia.  It allows you and your caregiver to make adjustments in response to changes in lifestyle for individuals requiring medicine.  It determines the need for starting insulin therapy in temporary diabetes that happens during pregnancy (gestational diabetes). Document Released: 02/17/2003 Document Revised: 05/09/2011 Document Reviewed: 06/10/2010 [facility] Patient Information 2014 [place], [place].

## 2012-09-25 NOTE — Progress Notes (Signed)
Patient ID: Taylor Warren, female   DOB: Jun 16, 1985, 27 y.o.   MRN: [id number]  CC: follow up  Interpreter used to communicate directly with patient for entire encounter including providing detailed patient instructions  HPI: Pt didn't get any of her prescripitons filled from last visit.  Still having dysuria and back pain but did not get antibiotic prescription filled. Pt says that she is not having shoulder pain, but pain in left scapula and thoracic and cervical spine.  She says that pain is intermittent.  Pt does not know of any particular injury.  Pt says that she is using some old insulin that she had before but has not been able to get prescription filled until she has her Medicaid benefits.   No Known Allergies Past Medical History  Diagnosis Date  . Diabetes mellitus without complication    Current Outpatient Prescriptions on File Prior to Visit  Medication Sig Dispense Refill  . insulin NPH-regular (NOVOLIN 70/30) (70-30) 100 UNIT/ML injection 15 units breakfast and lunch. 20 units [place] in evening.  10 mL  3  . insulin regular (HUMULIN R) 100 units/mL injection Sliding scale [place]  10 mL  3  . nitrofurantoin, macrocrystal-monohydrate, (MACROBID) 100 MG capsule Take 1 capsule (100 mg total) by mouth 2 (two) times daily.  10 capsule  0  . traMADol (ULTRAM) 50 MG tablet Take 1 tablet (50 mg total) by mouth every 8 (eight) hours as needed for pain.  30 tablet  0   No current facility-administered medications on file prior to visit.   No family history on file. History   Social History  . Marital Status: Married    Spouse Name: N/A    Number of Children: N/A  . Years of Education: N/A   Occupational History  . Not on file.   Social History Main Topics  . Smoking status: Never Smoker   . Smokeless tobacco: Not on file  . Alcohol Use: No  . Drug Use: No  . Sexually Active: Not on file   Other Topics Concern  . Not on file   Social History Narrative  . No narrative on file     Review of Systems  Constitutional: Negative for fever, chills, diaphoresis, activity change, appetite change and fatigue.  HENT: Negative for ear pain, nosebleeds, congestion, facial swelling, rhinorrhea, neck pain, neck stiffness and ear discharge.   Eyes: Negative for pain, discharge, redness, itching and visual disturbance.  Respiratory: Negative for cough, choking, chest tightness, shortness of breath, wheezing and stridor.   Cardiovascular: Negative for chest pain, palpitations and leg swelling.  Gastrointestinal: Negative for abdominal distention.  Genitourinary: Negative for dysuria, urgency, frequency, hematuria, flank pain, decreased urine volume, difficulty urinating and dyspareunia.  Musculoskeletal: Negative for back pain, joint swelling, arthralgias and gait problem.  Neurological: Negative for dizziness, tremors, seizures, syncope, facial asymmetry, speech difficulty, weakness, light-headedness, numbness and headaches.  Hematological: Negative for adenopathy. Does not bruise/bleed easily.  Psychiatric/Behavioral: Negative for hallucinations, behavioral problems, confusion, dysphoric mood, decreased concentration and agitation.    Objective:   Filed Vitals:   09/25/12 1730  BP: 103/70  Pulse: 82  Temp: 98.5 F (36.9 C)    Physical Exam  Constitutional: Appears well-developed and well-nourished. No distress.  HENT: Normocephalic. External right and left ear normal. Oropharynx is clear and moist.  Eyes: Conjunctivae and EOM are normal. PERRLA, no scleral icterus.  Neck: Normal ROM. Neck supple. No JVD. No tracheal deviation. No thyromegaly.  CVS: RRR, S1/S2 +, no murmurs,  no gallops, no carotid bruit.  Pulmonary: Effort and breath sounds normal, no stridor, rhonchi, wheezes, rales.  Abdominal: Soft. BS +,  no distension, tenderness, rebound or guarding.  Musculoskeletal: Normal range of motion. No edema and mild tenderness in left subscapularis and cervical thoracic  spine with palpation.  Lymphadenopathy: No lymphadenopathy noted, cervical, inguinal. Neuro: Alert. Normal reflexes, muscle tone coordination. No cranial nerve deficit. Skin: Skin is warm and dry. No rash noted. Not diaphoretic. No erythema. No pallor.  Psychiatric: Normal mood and affect. Behavior, judgment, thought content normal.   Lab Results  Component Value Date   HGB 10.5* 09/13/2012   HCT 31.0* 09/13/2012   Lab Results  Component Value Date   CREATININE 0.60 09/13/2012   BUN 17 09/13/2012   NA 140 09/13/2012   K 4.0 09/13/2012   CL 104 09/13/2012    No results found for this basename: HGBA1C   Lipid Panel  No results found for this basename: chol, trig, hdl, cholhdl, vldl, ldlcalc       Assessment and plan:   Patient Active Problem List   Diagnosis Date Noted  . Neck pain 09/25/2012  . Back pain 09/25/2012  . Hyperglycemia 09/25/2012  . Type I (juvenile type) diabetes mellitus without mention of complication, not stated as uncontrolled 09/18/2012  . UTI (urinary tract infection) 09/18/2012  . Left shoulder pain 09/18/2012   UTI (urinary tract infection) - Plan: Urinalysis Dipstick, Urine culture, DG Thoracic Spine 2 View, DG Cervical Spine Complete  Type I (juvenile type) diabetes mellitus without mention of complication, not stated as uncontrolled - Plan: HgB A1c, DG Thoracic Spine 2 View, DG Cervical Spine Complete  Abdominal  pain, other specified site - Plan: POCT urine pregnancy, DG Thoracic Spine 2 View, DG Cervical Spine Complete  Neck pain - Plan: DG Thoracic Spine 2 View, DG Cervical Spine Complete  Back pain - Plan: DG Thoracic Spine 2 View, DG Cervical Spine Complete  Hyperglycemia Check labs today  Culture urine  Pt advised to take antibiotic prescription as prescribed last visit  Monitor BS closely advised Check xrays  Of neck and thoracic spine  Family Planning advice counseled  RTC in 1 month  The patient was given clear instructions to go  to ER or return to medical center if symptoms don't improve, worsen or new problems develop.  The patient verbalized understanding.  The patient was told to call to get any lab results if not heard anything in the next week.    Rodney Langton, MD, CDE, FAAFP [facility] [place], [place]   Results for orders placed in visit on 09/25/12  CBC      Result Value Range   WBC 5.8  4.0 - 10.5 K/uL   RBC 4.64  3.87 - 5.11 MIL/uL   Hemoglobin 7.4 (*) 12.0 - 15.0 g/dL   HCT 16.1 (*) 09.6 - 04.5 %   MCV 59.1 (*) 78.0 - 100.0 fL   MCH 15.9 (*) 26.0 - 34.0 pg   MCHC 27.0 (*) 30.0 - 36.0 g/dL   RDW 40.9 (*) 81.1 - 91.4 %   Platelets 472 (*) 150 - 400 K/uL  VITAMIN B12      Result Value Range   Vitamin B-12 953 (*) 211 - 911 pg/mL  TSH      Result Value Range   TSH 1.083  0.350 - 4.500 uIU/mL  COMPLETE METABOLIC PANEL WITH GFR      Result Value Range   Sodium  137  135 - 145 mEq/L   Potassium 4.5  3.5 - 5.3 mEq/L   Chloride 103  96 - 112 mEq/L   CO2 28  19 - 32 mEq/L   Glucose, Bld 170 (*) 70 - 99 mg/dL   BUN 18  6 - 23 mg/dL   Creat 1.61  0.96 - 0.45 mg/dL   Total Bilirubin 0.5  0.3 - 1.2 mg/dL   Alkaline Phosphatase 45  39 - 117 U/L   AST 22  0 - 37 U/L   ALT 18  0 - 35 U/L   Total Protein 6.7  6.0 - 8.3 g/dL   Albumin 4.2  3.5 - 5.2 g/dL   Calcium 9.1  8.4 - 40.9 mg/dL   GFR, Est African American >89     GFR, Est Non African American >89

## 2012-09-26 ENCOUNTER — Telehealth: Payer: Self-pay | Admitting: *Deleted

## 2012-09-26 ENCOUNTER — Other Ambulatory Visit: Payer: Self-pay | Admitting: Family Medicine

## 2012-09-26 DIAGNOSIS — D509 Iron deficiency anemia, unspecified: Secondary | ICD-10-CM | POA: Insufficient documentation

## 2012-09-26 LAB — COMPLETE METABOLIC PANEL WITH GFR
ALT: 18 U/L (ref 0–35)
AST: 22 U/L (ref 0–37)
Alkaline Phosphatase: 45 U/L (ref 39–117)
BUN: 18 mg/dL (ref 6–23)
Calcium: 9.1 mg/dL (ref 8.4–10.5)
Creat: 0.71 mg/dL (ref 0.50–1.10)
Total Bilirubin: 0.5 mg/dL (ref 0.3–1.2)

## 2012-09-26 LAB — VITAMIN B12: Vitamin B-12: 953 pg/mL — ABNORMAL HIGH (ref 211–911)

## 2012-09-26 LAB — TSH: TSH: 1.083 u[IU]/mL (ref 0.350–4.500)

## 2012-09-26 LAB — CBC
MCH: 15.9 pg — ABNORMAL LOW (ref 26.0–34.0)
MCHC: 27 g/dL — ABNORMAL LOW (ref 30.0–36.0)
Platelets: 472 10*3/uL — ABNORMAL HIGH (ref 150–400)

## 2012-09-26 MED ORDER — FERROUS SULFATE 325 (65 FE) MG PO TBEC: 325.0000 mg | DELAYED_RELEASE_TABLET | Freq: Two times a day (BID) | ORAL | Status: DC

## 2012-09-26 NOTE — Telephone Encounter (Signed)
09/26/12 Patient not available spoke with daughter made aware of lab results she is very anemic Instructed to start taking iron tablets everyday. Prescription called into [facility] on  [address] per patient request. Appointment schedule for recheck in 2 weeks. P.Rick Carruthers,RN BSN [facility]

## 2012-09-26 NOTE — Progress Notes (Signed)
Quick Note:  Please inform patient that her labs came back and reveal that she is very anemic. Please find out if she is having heavy menstrual periods. Her B12 and metabolic panel came back OK. Pt needs to start taking iron tablets everyday. Please call in prescription for ferrous sulfate 325 mg, take 1 po twice daily with meals, #60, RFx2. We need to recheck her CBC in 2 weeks.   Rodney Langton, MD, CDE, FAAFP [facility] [place], [place]   ______

## 2012-09-26 NOTE — Telephone Encounter (Signed)
Left a voice mail for the social worker to call me back. I'm waiting for her medicaid to refer her to a endocrinology here in [place] .

## 2012-09-26 NOTE — Progress Notes (Signed)
Quick Note:  Please inform patient that xrays revealed that she had very minimal scoliosis but otherwise they came back normal.   Rodney Langton, MD, CDE, FAAFP [facility] [place], [place]   ______

## 2012-09-26 NOTE — Telephone Encounter (Signed)
09/26/12 Patient made aware that X-ray revealed minimal scoliosis but otherwise normal. P.Zerek Litsey,RN BSN [facility]HA

## 2012-09-27 LAB — URINE CULTURE
Colony Count: NO GROWTH
Organism ID, Bacteria: NO GROWTH

## 2012-10-02 ENCOUNTER — Emergency Department (HOSPITAL_COMMUNITY)
Admission: EM | Admit: 2012-10-02 | Discharge: 2012-10-02 | Disposition: A | Discharge status: Home / Self Care | Payer: Medicaid Other | Attending: Emergency Medicine | Admitting: Emergency Medicine

## 2012-10-02 ENCOUNTER — Encounter (HOSPITAL_COMMUNITY): Payer: Self-pay | Admitting: Emergency Medicine

## 2012-10-02 DIAGNOSIS — Z3202 Encounter for pregnancy test, result negative: Secondary | ICD-10-CM | POA: Insufficient documentation

## 2012-10-02 DIAGNOSIS — E11649 Type 2 diabetes mellitus with hypoglycemia without coma: Secondary | ICD-10-CM

## 2012-10-02 DIAGNOSIS — Z794 Long term (current) use of insulin: Secondary | ICD-10-CM | POA: Insufficient documentation

## 2012-10-02 DIAGNOSIS — D649 Anemia, unspecified: Secondary | ICD-10-CM | POA: Insufficient documentation

## 2012-10-02 DIAGNOSIS — Z79899 Other long term (current) drug therapy: Secondary | ICD-10-CM | POA: Insufficient documentation

## 2012-10-02 DIAGNOSIS — E1069 Type 1 diabetes mellitus with other specified complication: Secondary | ICD-10-CM | POA: Insufficient documentation

## 2012-10-02 DIAGNOSIS — Z87440 Personal history of urinary (tract) infections: Secondary | ICD-10-CM | POA: Insufficient documentation

## 2012-10-02 HISTORY — DX: Anemia, unspecified: D64.9

## 2012-10-02 HISTORY — DX: Urinary tract infection, site not specified: N39.0

## 2012-10-02 LAB — URINALYSIS, ROUTINE W REFLEX MICROSCOPIC
Bilirubin Urine: NEGATIVE
Hgb urine dipstick: NEGATIVE
Ketones, ur: NEGATIVE mg/dL
Specific Gravity, Urine: 1.008 (ref 1.005–1.030)
Urobilinogen, UA: 0.2 mg/dL (ref 0.0–1.0)

## 2012-10-02 LAB — GLUCOSE, CAPILLARY
Glucose-Capillary: 41 mg/dL — CL (ref 70–99)
Glucose-Capillary: 123 mg/dL — ABNORMAL HIGH (ref 70–99)

## 2012-10-02 LAB — URINE MICROSCOPIC-ADD ON

## 2012-10-02 LAB — PREGNANCY, URINE: Preg Test, Ur: NEGATIVE

## 2012-10-02 MED ORDER — DEXTROSE 50 % IV SOLN
INTRAVENOUS | Status: AC
  Administered 2012-10-02: 25 mL
  Filled 2012-10-02: qty 50

## 2012-10-02 NOTE — ED Notes (Signed)
cbg was 70

## 2012-10-02 NOTE — ED Provider Notes (Signed)
CSN: [id number]     Arrival date & time 10/02/12  0034 History     First MD Initiated Contact with Patient 10/02/12 [phone]     Chief Complaint  Patient presents with  . Hypoglycemia   (Consider location/radiation/quality/duration/timing/severity/associated sxs/prior Treatment) HPI 27 yo female presents to the ER from home with reported low blood sugar.  Pt is type 1 diabetic, and has been since age 41.  Family reports she is not good with keeping up with her blood sugars, and is often poor about her regimen.  She has recently moved her from [place] about a month ago.  She has seen the [facility], and they are working on getting her a referral to endocrinology.  Pt checks her blood sugar only about 1/week.  Tonight she took her normal dosing of regular and 70/30.  Soon after she told family that she felt tired and shaky, and asked for something to eat.  They gave her several things, but then she threw up.  Pt without complaints at this time. Her initial bs on arrival was 70, but did drop to 40 while here in the ER.  She has since drank juice and ate a sandwich.  Past Medical History  Diagnosis Date  . Diabetes mellitus without complication   . Anemia   . Urinary tract infection     pt still taking medication at this time   History reviewed. No pertinent past surgical history. History reviewed. No pertinent family history. History  Substance Use Topics  . Smoking status: Never Smoker   . Smokeless tobacco: Never Used  . Alcohol Use: No   OB History   Grav Para Term Preterm Abortions TAB SAB Ect Mult Living                 Review of Systems  All other systems reviewed and are negative.    Allergies  Review of patient's allergies indicates no known allergies.  Home Medications   Current Outpatient Rx  Name  Route  Sig  Dispense  Refill  . ferrous sulfate 325 (65 FE) MG EC tablet   Oral   Take 1 tablet (325 mg total) by mouth 2 (two) times daily with a meal.      3   .  insulin NPH-regular (NOVOLIN 70/30) (70-30) 100 UNIT/ML injection      15 units breakfast and lunch. 20 units  in evening.   10 mL   3   . insulin regular (NOVOLIN R,HUMULIN R) 100 units/mL injection   Subcutaneous   Inject 3 Units into the skin 3 (three) times daily before meals. Sliding scale         . nitrofurantoin, macrocrystal-monohydrate, (MACROBID) 100 MG capsule   Oral   Take 1 capsule (100 mg total) by mouth 2 (two) times daily.   10 capsule   0    BP 113/59  Pulse 87  Temp(Src) 98.5 F (36.9 C) (Oral)  Resp 18  SpO2 100%  LMP 09/18/2012 Physical Exam  Nursing note and vitals reviewed. Constitutional: She is oriented to person, place, and time. She appears well-developed and well-nourished.  HENT:  Head: Normocephalic and atraumatic.  Right Ear: External ear normal.  Left Ear: External ear normal.  Nose: Nose normal.  Mouth/Throat: Oropharynx is clear and moist.  Eyes: Conjunctivae and EOM are normal. Pupils are equal, round, and reactive to light.  Neck: Normal range of motion. Neck supple. No JVD present. No tracheal deviation present. No thyromegaly present.  Cardiovascular: Normal rate, regular rhythm, normal heart sounds and intact distal pulses.  Exam reveals no gallop and no friction rub.   No murmur heard. Pulmonary/Chest: Effort normal and breath sounds normal. No stridor. No respiratory distress. She has no wheezes. She has no rales. She exhibits no tenderness.  Abdominal: Soft. Bowel sounds are normal. She exhibits no distension and no mass. There is no tenderness. There is no rebound and no guarding.  Musculoskeletal: Normal range of motion. She exhibits no edema and no tenderness.  Lymphadenopathy:    She has no cervical adenopathy.  Neurological: She is alert and oriented to person, place, and time. She has normal reflexes. She exhibits normal muscle tone. Coordination normal.  Skin: Skin is warm and dry. No rash noted. No erythema. No pallor.   Psychiatric: She has a normal mood and affect. Her behavior is normal. Judgment and thought content normal.    ED Course   Procedures (including critical care time)  Labs Reviewed  GLUCOSE, CAPILLARY - Abnormal; Notable for the following:    Glucose-Capillary 41 (*)    All other components within normal limits  GLUCOSE, CAPILLARY - Abnormal; Notable for the following:    Glucose-Capillary 123 (*)    All other components within normal limits  URINALYSIS, ROUTINE W REFLEX MICROSCOPIC - Abnormal; Notable for the following:    Leukocytes, UA TRACE (*)    All other components within normal limits  GLUCOSE, CAPILLARY - Abnormal; Notable for the following:    Glucose-Capillary 237 (*)    All other components within normal limits  URINE MICROSCOPIC-ADD ON - Abnormal; Notable for the following:    Bacteria, UA FEW (*)    All other components within normal limits  URINE CULTURE  PREGNANCY, URINE  GLUCOSE, CAPILLARY   No results found. 1. Diabetic hypoglycemia     MDM  27 yo female with poorly controlled diabetes despite knowing how to properly care for her condition.  Pt's nurse tonight, Harriett Sine is a diabetic and spent some time trying to educate her and her family the importance of keeping up with her sugars and managing her disease.  She has received her medicaid paperwork.  Will have the family contact the wellness center for referral to endocrine.  Olivia Mackie, MD 10/02/12 [phone]

## 2012-10-02 NOTE — ED Notes (Signed)
Pt presents with family to ED with low blood sugar.  Family states that pt was lying on couch appearing to be tired and asked for something to eat.  Family members gave her cake, chicken, [employer], Pepsi, pancake syrup, cheese sandwich and a lot of sugar (4 tablespoons).  After that, family states she threw up.  Family decided to bring pt to ED.  Pt c/o abdominal pain, no pain and pt feels better from hypoglycemic episode, just cold.

## 2012-10-02 NOTE — ED Notes (Signed)
Pt states that she only checks CBG once per week.  Advised pt that is not a good regimen.  Must check at least 3 times per day when she takes medication so she will know what her CBG is when she takes her medication so she won't end up here again with a low blood sugar.

## 2012-10-02 NOTE — ED Notes (Signed)
1/2 [place] sandwich and 4 oz. Of orange juice consumed by pt. For low CBG of 41.  Dr. Norlene Campbell aware of situation.  Will recheck in 15 minutes.

## 2012-10-02 NOTE — ED Notes (Signed)
Pt is here with hypoglycemia.  Terrance RN gave patient some educational guides in reference to diabetes.  Pt has been up to restroom.  No vomiting or pain and will recheck blood sugar in one hour.

## 2012-10-03 LAB — URINE CULTURE: Culture: NO GROWTH

## 2012-10-10 ENCOUNTER — Ambulatory Visit (HOSPITAL_BASED_OUTPATIENT_CLINIC_OR_DEPARTMENT_OTHER): Payer: Medicaid Other | Admitting: Internal Medicine

## 2012-10-10 ENCOUNTER — Emergency Department (HOSPITAL_COMMUNITY)
Admission: EM | Admit: 2012-10-10 | Discharge: 2012-10-10 | Disposition: A | Discharge status: Home / Self Care | Payer: Medicaid Other | Attending: Emergency Medicine | Admitting: Emergency Medicine

## 2012-10-10 ENCOUNTER — Encounter (HOSPITAL_COMMUNITY): Payer: Self-pay | Admitting: *Deleted

## 2012-10-10 ENCOUNTER — Ambulatory Visit (HOSPITAL_BASED_OUTPATIENT_CLINIC_OR_DEPARTMENT_OTHER): Payer: Medicaid Other

## 2012-10-10 VITALS — BP 111/75 | HR 91 | Temp 97.0°F | Resp 16

## 2012-10-10 DIAGNOSIS — Z794 Long term (current) use of insulin: Secondary | ICD-10-CM | POA: Insufficient documentation

## 2012-10-10 DIAGNOSIS — E1310 Other specified diabetes mellitus with ketoacidosis without coma: Secondary | ICD-10-CM

## 2012-10-10 DIAGNOSIS — E162 Hypoglycemia, unspecified: Secondary | ICD-10-CM

## 2012-10-10 DIAGNOSIS — Z87440 Personal history of urinary (tract) infections: Secondary | ICD-10-CM | POA: Insufficient documentation

## 2012-10-10 DIAGNOSIS — E109 Type 1 diabetes mellitus without complications: Secondary | ICD-10-CM | POA: Insufficient documentation

## 2012-10-10 DIAGNOSIS — E1169 Type 2 diabetes mellitus with other specified complication: Secondary | ICD-10-CM | POA: Insufficient documentation

## 2012-10-10 DIAGNOSIS — D509 Iron deficiency anemia, unspecified: Secondary | ICD-10-CM

## 2012-10-10 DIAGNOSIS — Z79899 Other long term (current) drug therapy: Secondary | ICD-10-CM | POA: Insufficient documentation

## 2012-10-10 DIAGNOSIS — D649 Anemia, unspecified: Secondary | ICD-10-CM | POA: Insufficient documentation

## 2012-10-10 DIAGNOSIS — E1110 Type 2 diabetes mellitus with ketoacidosis without coma: Secondary | ICD-10-CM

## 2012-10-10 LAB — BASIC METABOLIC PANEL
BUN: 15 mg/dL (ref 6–23)
CO2: 23 mEq/L (ref 19–32)
GFR calc non Af Amer: >90 mL/min (ref >90)
Glucose, Bld: 178 mg/dL — ABNORMAL HIGH (ref 70–99)
Potassium: 3.4 mEq/L — ABNORMAL LOW (ref 3.5–5.1)

## 2012-10-10 LAB — POCT GLYCOSYLATED HEMOGLOBIN (HGB A1C): Hemoglobin A1C: 7.2

## 2012-10-10 LAB — CBC WITH DIFFERENTIAL/PLATELET
Basophils Relative: 0 % (ref 0–1)
Eosinophils Absolute: 0.2 10*3/uL (ref 0.0–0.7)
HCT: 32.1 % — ABNORMAL LOW (ref 36.0–46.0)
Hemoglobin: 9.5 g/dL — ABNORMAL LOW (ref 12.0–15.0)
Lymphocytes Relative: 13 % (ref 12–46)
MCH: 19.2 pg — ABNORMAL LOW (ref 26.0–34.0)
MCHC: 29.6 g/dL — ABNORMAL LOW (ref 30.0–36.0)
Neutro Abs: 8.1 10*3/uL — ABNORMAL HIGH (ref 1.7–7.7)

## 2012-10-10 LAB — GLUCOSE, POCT (MANUAL RESULT ENTRY): POC Glucose: 322 mg/dl — AB (ref 70–99)

## 2012-10-10 MED ORDER — GLUCOSE BLOOD VI STRP: ORAL_STRIP | Status: DC

## 2012-10-10 MED ORDER — FREESTYLE SYSTEM KIT: 1.0000 | PACK | Status: DC | PRN

## 2012-10-10 MED ORDER — DEXTROSE 50 % IV SOLN
INTRAVENOUS | Status: AC
  Administered 2012-10-10: 50 mL
  Filled 2012-10-10: qty 50

## 2012-10-10 MED ORDER — SODIUM CHLORIDE 0.9 % IV SOLN: 1000.0000 mL | INTRAVENOUS | Status: DC

## 2012-10-10 MED ORDER — SODIUM CHLORIDE 0.9 % IV SOLN
1000.0000 mL | Freq: Once | INTRAVENOUS | Status: AC
  Administered 2012-10-10: 1000 mL via INTRAVENOUS

## 2012-10-10 NOTE — ED Provider Notes (Signed)
CSN: [id number]     Arrival date & time 10/10/12  0330 History     First MD Initiated Contact with Patient 10/10/12 0331     Chief Complaint  Patient presents with  . Hypoglycemia   (Consider location/radiation/quality/duration/timing/severity/associated sxs/prior Treatment) Patient is a 27 y.o. female presenting with hypoglycemia. The history is provided by the patient and a relative.  Hypoglycemia She was brought in by ambulance after being found at home with a low blood sugar. She had he normally is small amount of food for dinner this evening. There's been no nausea or vomiting. EMS reported blood sugar of 20 and gave her an amp of D50. Blood sugar is 202 on arrival in the ED.  Past Medical History  Diagnosis Date  . Diabetes mellitus without complication   . Anemia   . Urinary tract infection     pt still taking medication at this time   No past surgical history on file. No family history on file. History  Substance Use Topics  . Smoking status: Never Smoker   . Smokeless tobacco: Never Used  . Alcohol Use: No   OB History   Grav Para Term Preterm Abortions TAB SAB Ect Mult Living                 Review of Systems  All other systems reviewed and are negative.    Allergies  Review of patient's allergies indicates no known allergies.  Home Medications   Current Outpatient Rx  Name  Route  Sig  Dispense  Refill  . ferrous sulfate 325 (65 FE) MG EC tablet   Oral   Take 1 tablet (325 mg total) by mouth 2 (two) times daily with a meal.      3   . insulin NPH-regular (NOVOLIN 70/30) (70-30) 100 UNIT/ML injection      15 units breakfast and lunch. 20 units [place] in evening.   10 mL   3   . insulin regular (NOVOLIN R,HUMULIN R) 100 units/mL injection   Subcutaneous   Inject 3 Units into the skin 3 (three) times daily before meals. Sliding scale         . nitrofurantoin, macrocrystal-monohydrate, (MACROBID) 100 MG capsule   Oral   Take 1 capsule (100 mg  total) by mouth 2 (two) times daily.   10 capsule   0    BP 110/73  Pulse 116  Temp(Src) 98 F (36.7 C)  Resp 18  SpO2 100%  LMP 09/18/2012 Physical Exam  Nursing note and vitals reviewed.  27 year old female, resting comfortably and in no acute distress. Vital signs are significant for tachycardia with heart rate of 116. Oxygen saturation is 100%, which is normal. Head is normocephalic and atraumatic. PERRLA, EOMI. Oropharynx is clear. Neck is nontender and supple without adenopathy or JVD. Back is nontender and there is no CVA tenderness. Lungs are clear without rales, wheezes, or rhonchi. Chest is nontender. Heart has regular rate and rhythm without murmur. Abdomen is soft, flat, nontender without masses or hepatosplenomegaly and peristalsis is normoactive. Extremities have no cyanosis or edema, full range of motion is present. Skin is warm and dry without rash. Neurologic: Mental status is normal, cranial nerves are intact, there are no motor or sensory deficits.  ED Course   Procedures (including critical care time)  Results for orders placed during the hospital encounter of 10/10/12  GLUCOSE, CAPILLARY      Result Value Range   Glucose-Capillary 202 (*) 70 -  99 mg/dL  CBC WITH DIFFERENTIAL      Result Value Range   WBC 10.3  4.0 - 10.5 K/uL   RBC 4.94  3.87 - 5.11 MIL/uL   Hemoglobin 9.5 (*) 12.0 - 15.0 g/dL   HCT 30.8 (*) 65.7 - 84.6 %   MCV 65.0 (*) 78.0 - 100.0 fL   MCH 19.2 (*) 26.0 - 34.0 pg   MCHC 29.6 (*) 30.0 - 36.0 g/dL   RDW 96.2 (*) 95.2 - 84.1 %   Platelets 258  150 - 400 K/uL   Neutrophils Relative % PENDING  43 - 77 %   Neutro Abs PENDING  1.7 - 7.7 K/uL   Band Neutrophils PENDING  0 - 10 %   Lymphocytes Relative PENDING  12 - 46 %   Lymphs Abs PENDING  0.7 - 4.0 K/uL   Monocytes Relative PENDING  3 - 12 %   Monocytes Absolute PENDING  0.1 - 1.0 K/uL   Eosinophils Relative PENDING  0 - 5 %   Eosinophils Absolute PENDING  0.0 - 0.7 K/uL    Basophils Relative PENDING  0 - 1 %   Basophils Absolute PENDING  0.0 - 0.1 K/uL   WBC Morphology PENDING     RBC Morphology PENDING     Smear Review PENDING     nRBC PENDING  0 /100 WBC   Metamyelocytes Relative PENDING     Myelocytes PENDING     Promyelocytes Absolute PENDING     Blasts PENDING    BASIC METABOLIC PANEL      Result Value Range   Sodium 135  135 - 145 mEq/L   Potassium 3.4 (*) 3.5 - 5.1 mEq/L   Chloride 101  96 - 112 mEq/L   CO2 23  19 - 32 mEq/L   Glucose, Bld 178 (*) 70 - 99 mg/dL   BUN 15  6 - 23 mg/dL   Creatinine, Ser 3.24  0.50 - 1.10 mg/dL   Calcium 8.9  8.4 - 40.1 mg/dL   GFR calc non Af Amer >90  >90 mL/min   GFR calc Af Amer >90  >90 mL/min  GLUCOSE, CAPILLARY      Result Value Range   Glucose-Capillary 171 (*) 70 - 99 mg/dL  GLUCOSE, CAPILLARY      Result Value Range   Glucose-Capillary 257 (*) 70 - 99 mg/dL      1. Hypoglycemia     MDM  Hypoglycemia. Old records are reviewed and she has several other ED visits for hypoglycemia. She will be given a snack in the ED and observed to make sure her sugar is stable.  Dione Booze, MD 10/10/12 [phone]

## 2012-10-10 NOTE — Progress Notes (Unsigned)
PATIENT was supposed to be here for labs only Was in the ed yesterday for blood sugar of 24 Today presents with blood sugar of 322

## 2012-10-10 NOTE — Progress Notes (Signed)
Patient ID: Taylor Warren, female   DOB: February 12, 1986, 27 y.o.   MRN: [id number]  CC: Followup ER visit  HPI: Patient is a 27 years old woman who was recently seen in the ER because of a hypoglycemic episode. She is on insulin 15 units morning and afternoon, and 20 units at night. Was found to be sleeping excessively and may have actually had a syncopal attack, was taken to ER by parents her blood sugar was found to be 20. She was admitted and treated appropriately and discharged to be followed up in the clinic. No complaints today. She would like to convert that to insulin pump.  No Known Allergies Past Medical History  Diagnosis Date  . Diabetes mellitus without complication   . Anemia   . Urinary tract infection     pt still taking medication at this time   Current Outpatient Prescriptions on File Prior to Visit  Medication Sig Dispense Refill  . insulin NPH-regular (NOVOLIN 70/30) (70-30) 100 UNIT/ML injection 15 units breakfast and lunch. 20 units [place] in evening.  10 mL  3  . insulin regular (NOVOLIN R,HUMULIN R) 100 units/mL injection Inject 3 Units into the skin 3 (three) times daily before meals. Sliding scale       No current facility-administered medications on file prior to visit.   No family history on file. History   Social History  . Marital Status: Married    Spouse Name: N/A    Number of Children: N/A  . Years of Education: N/A   Occupational History  . Not on file.   Social History Main Topics  . Smoking status: Never Smoker   . Smokeless tobacco: Never Used  . Alcohol Use: No  . Drug Use: No  . Sexual Activity: Yes   Other Topics Concern  . Not on file   Social History Narrative  . No narrative on file    Review of Systems: Constitutional: Negative for fever, chills, diaphoresis, activity change, appetite change and fatigue. HENT: Negative for ear pain, nosebleeds, congestion, facial swelling, rhinorrhea, neck pain, neck stiffness and ear discharge.   Eyes: Negative for pain, discharge, redness, itching and visual disturbance. Respiratory: Negative for cough, choking, chest tightness, shortness of breath, wheezing and stridor.  Cardiovascular: Negative for chest pain, palpitations and leg swelling. Gastrointestinal: Negative for abdominal distention. Genitourinary: Negative for dysuria, urgency, frequency, hematuria, flank pain, decreased urine volume, difficulty urinating and dyspareunia.  Musculoskeletal: Negative for back pain, joint swelling, arthralgias and gait problem. Neurological: Negative for dizziness, tremors, seizures, syncope, facial asymmetry, speech difficulty, weakness, light-headedness, numbness and headaches.  Hematological: Negative for adenopathy. Does not bruise/bleed easily. Psychiatric/Behavioral: Negative for hallucinations, behavioral problems, confusion, dysphoric mood, decreased concentration and agitation.    Objective:  There were no vitals filed for this visit.  Physical Exam: Constitutional: Patient appears well-developed and well-nourished. No distress. HENT: Normocephalic, atraumatic, External right and left ear normal. Oropharynx is clear and moist.  Eyes: Conjunctivae and EOM are normal. PERRLA, no scleral icterus. Neck: Normal ROM. Neck supple. No JVD. No tracheal deviation. No thyromegaly. CVS: RRR, S1/S2 +, no murmurs, no gallops, no carotid bruit.  Pulmonary: Effort and breath sounds normal, no stridor, rhonchi, wheezes, rales.  Abdominal: Soft. BS +,  no distension, tenderness, rebound or guarding.  Musculoskeletal: Normal range of motion. No edema and no tenderness.  Lymphadenopathy: No lymphadenopathy noted, cervical, inguinal or axillary Neuro: Alert. Normal reflexes, muscle tone coordination. No cranial nerve deficit. Skin: Skin is warm and dry.  No rash noted. Not diaphoretic. No erythema. No pallor. Psychiatric: Normal mood and affect. Behavior, judgment, thought content normal.  Lab  Results  Component Value Date   WBC 10.3 10/10/2012   HGB 9.5* 10/10/2012   HCT 32.1* 10/10/2012   MCV 65.0* 10/10/2012   PLT 258 10/10/2012   Lab Results  Component Value Date   CREATININE 0.58 10/10/2012   BUN 15 10/10/2012   NA 135 10/10/2012   K 3.4* 10/10/2012   CL 101 10/10/2012   CO2 23 10/10/2012    Lab Results  Component Value Date   HGBA1C 7.2% 10/10/2012   Lipid Panel  No results found for this basename: chol, trig, hdl, cholhdl, vldl, ldlcalc       Assessment and plan:   Patient Active Problem List   Diagnosis Date Noted  . Anemia, iron deficiency 09/26/2012  . Neck pain 09/25/2012  . Back pain 09/25/2012  . Hyperglycemia 09/25/2012  . Type I (juvenile type) diabetes mellitus without mention of complication, not stated as uncontrolled 09/18/2012  . UTI (urinary tract infection) 09/18/2012  . Left shoulder pain 09/18/2012   Patient given strongly instruction on insulin use Reduce insulin dose to 10 units 3 times a day Record ambulatory blood sugar control glucometer check pre-insulin use daily for one week Return to clinic in one week to review dosage of insulin a possibility of of converting to pump Glucometer prescription was given for easy blood sugar check  Taylor Warren was given clear instructions to go to ER or return to the clinic if symptoms don't improve, worsen or new problems develop.  Taylor Warren verbalized understanding.  Taylor Warren was told to call to get lab results if hasn't heard anything in the next week.    Interpreter was used to communicate directly with patient for the entire encounter including providing detailed patient instructions.       Jeanann Lewandowsky, MD [facility] [place], [place] [phone]   10/10/2012, 10:33 AM

## 2012-10-10 NOTE — ED Notes (Signed)
Eating [place] sandwich at this time

## 2012-10-10 NOTE — ED Notes (Signed)
Patient presents from home with family stating that her sugar was 20 at home.  Patient was pale color, would look at you but not respond.  Family member gave her 4 sugar tables at home but they did not help.

## 2012-10-11 ENCOUNTER — Encounter: Payer: Self-pay | Admitting: Internal Medicine

## 2012-10-17 ENCOUNTER — Ambulatory Visit: Payer: Medicaid Other

## 2012-10-18 ENCOUNTER — Encounter: Payer: Self-pay | Admitting: Internal Medicine

## 2012-10-18 ENCOUNTER — Ambulatory Visit: Payer: Medicaid Other | Attending: Family Medicine | Admitting: Internal Medicine

## 2012-10-18 VITALS — BP 113/79 | HR 87 | Temp 98.3°F | Resp 16 | Wt 116.0 lb

## 2012-10-18 DIAGNOSIS — K029 Dental caries, unspecified: Secondary | ICD-10-CM | POA: Insufficient documentation

## 2012-10-18 DIAGNOSIS — E109 Type 1 diabetes mellitus without complications: Secondary | ICD-10-CM | POA: Insufficient documentation

## 2012-10-18 DIAGNOSIS — Z3009 Encounter for other general counseling and advice on contraception: Secondary | ICD-10-CM

## 2012-10-18 DIAGNOSIS — E119 Type 2 diabetes mellitus without complications: Secondary | ICD-10-CM | POA: Insufficient documentation

## 2012-10-18 MED ORDER — INSULIN NPH ISOPHANE & REGULAR (70-30) 100 UNIT/ML ~~LOC~~ SUSP: 13.0000 [IU] | Freq: Three times a day (TID) | SUBCUTANEOUS | Status: DC

## 2012-10-18 NOTE — Progress Notes (Signed)
Patient ID: Taylor Warren, female   DOB: 18-Apr-1985, 27 y.o.   MRN: [id number]  CC: Followup  HPI: Patient present back to clinic today for followup visit. She was taken her insulin NovoLog 70/30 initially at 10 units 3 times a day, her blood glucose range between 200 and 400, she increased by herself to 15 units 3 times a day, and her glucose went down to 60 on more than 2 occasions with symptoms of hypoglycemia. Fortunately no hospital admission. She also complains of some dental caries and went to see a dentist. Her mother request that she see a woman doctor for family planning on pelvic exam.  No Known Allergies Past Medical History  Diagnosis Date  . Diabetes mellitus without complication   . Anemia   . Urinary tract infection     pt still taking medication at this time   Current Outpatient Prescriptions on File Prior to Visit  Medication Sig Dispense Refill  . glucose blood test strip Use as instructed  100 each  12  . glucose monitoring kit (FREESTYLE) monitoring kit 1 each by Does not apply route as needed for other.  1 each  1  . insulin regular (NOVOLIN R,HUMULIN R) 100 units/mL injection Inject 3 Units into the skin 3 (three) times daily before meals. Sliding scale       No current facility-administered medications on file prior to visit.   History reviewed. No pertinent family history. History   Social History  . Marital Status: Married    Spouse Name: N/A    Number of Children: N/A  . Years of Education: N/A   Occupational History  . Not on file.   Social History Main Topics  . Smoking status: Never Smoker   . Smokeless tobacco: Never Used  . Alcohol Use: No  . Drug Use: No  . Sexual Activity: Yes   Other Topics Concern  . Not on file   Social History Narrative  . No narrative on file    Review of Systems: Constitutional: Negative for fever, chills, diaphoresis, activity change, appetite change and fatigue. HENT: Negative for ear pain, nosebleeds,  congestion, facial swelling, rhinorrhea, neck pain, neck stiffness and ear discharge.  Eyes: Negative for pain, discharge, redness, itching and visual disturbance. Respiratory: Negative for cough, choking, chest tightness, shortness of breath, wheezing and stridor.  Cardiovascular: Negative for chest pain, palpitations and leg swelling. Gastrointestinal: Negative for abdominal distention. Genitourinary: Negative for dysuria, urgency, frequency, hematuria, flank pain, decreased urine volume, difficulty urinating and dyspareunia.  Musculoskeletal: Negative for back pain, joint swelling, arthralgias and gait problem. Neurological: Negative for dizziness, tremors, seizures, syncope, facial asymmetry, speech difficulty, weakness, light-headedness, numbness and headaches.  Hematological: Negative for adenopathy. Does not bruise/bleed easily. Psychiatric/Behavioral: Negative for hallucinations, behavioral problems, confusion, dysphoric mood, decreased concentration and agitation.    Objective:   Filed Vitals:   10/18/12 1719  BP: 113/79  Pulse: 87  Temp: 98.3 F (36.8 C)  Resp: 16    Physical Exam: Constitutional: Patient appears well-developed and well-nourished. No distress. HENT: Normocephalic, atraumatic, External right and left ear normal. Oropharynx is clear and moist.  Eyes: Conjunctivae and EOM are normal. PERRLA, no scleral icterus. Neck: Normal ROM. Neck supple. No JVD. No tracheal deviation. No thyromegaly. CVS: RRR, S1/S2 +, no murmurs, no gallops, no carotid bruit.  Pulmonary: Effort and breath sounds normal, no stridor, rhonchi, wheezes, rales.  Psychiatric: Normal mood and affect. Behavior, judgment, thought content normal.  Lab Results  Component Value Date  WBC 10.3 10/10/2012   HGB 9.5* 10/10/2012   HCT 32.1* 10/10/2012   MCV 65.0* 10/10/2012   PLT 258 10/10/2012   Lab Results  Component Value Date   CREATININE 0.58 10/10/2012   BUN 15 10/10/2012   NA 135 10/10/2012    K 3.4* 10/10/2012   CL 101 10/10/2012   CO2 23 10/10/2012    Lab Results  Component Value Date   HGBA1C 7.2% 10/10/2012   Lipid Panel  No results found for this basename: chol, trig, hdl, cholhdl, vldl, ldlcalc       Assessment and plan:   Patient Active Problem List   Diagnosis Date Noted  . 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 Anemia, iron deficiency 09/26/2012  . Neck pain 09/25/2012  . Back pain 09/25/2012  . Hyperglycemia 09/25/2012  . Type I (juvenile type) diabetes mellitus without mention of complication, not stated as uncontrolled 09/18/2012  . UTI (urinary tract infection) 09/18/2012  . Left shoulder pain 09/18/2012   NovoLog insulin 70/30 13 units 3 times a day Monitor blood sugar very closely Ambulatory referral to endocrinologist  Referral to dentistry for dental caries  Referral to gynecologist for family planning and pelvic exam  Eliyanna Paule was given clear instructions to go to ER or return to the clinic if symptoms don't improve, worsen or new problems develop.  Vivien Nuttall verbalized understanding.  Aryani Igo was told to call to get lab results if hasn't heard anything in the next week.        Jeanann Lewandowsky, MD [facility] [place], [place] [phone]   10/18/2012, 5:57 PM

## 2012-10-18 NOTE — Progress Notes (Signed)
Pt here for diabetes management- states new dosing causing increased blood levels Increased 1o units to 15units Denies sx at this time. Arabic translator present

## 2012-10-23 ENCOUNTER — Ambulatory Visit: Payer: Medicaid Other | Admitting: Endocrinology

## 2012-10-23 ENCOUNTER — Telehealth: Payer: Self-pay | Admitting: Internal Medicine

## 2012-10-23 NOTE — Telephone Encounter (Signed)
Pt needs lab results. The patient does not speak any english but her sister's number is in comments. Please note in comments.

## 2012-10-25 ENCOUNTER — Encounter: Payer: Self-pay | Admitting: Endocrinology

## 2012-10-25 ENCOUNTER — Ambulatory Visit (INDEPENDENT_AMBULATORY_CARE_PROVIDER_SITE_OTHER): Payer: Medicaid Other | Admitting: Endocrinology

## 2012-10-25 VITALS — BP 122/80 | HR 90 | Ht 65.0 in | Wt 118.0 lb

## 2012-10-25 DIAGNOSIS — E109 Type 1 diabetes mellitus without complications: Secondary | ICD-10-CM

## 2012-10-25 MED ORDER — INSULIN GLARGINE 100 UNIT/ML SOLOSTAR PEN: 18.0000 [IU] | PEN_INJECTOR | Freq: Every day | SUBCUTANEOUS | Status: DC

## 2012-10-25 MED ORDER — INSULIN ASPART 100 UNIT/ML FLEXPEN: 7.0000 [IU] | PEN_INJECTOR | Freq: Three times a day (TID) | SUBCUTANEOUS | Status: DC

## 2012-10-25 NOTE — Patient Instructions (Addendum)
good diet and exercise habits significanly improve the control of your diabetes.  please let me know if you wish to be referred to a dietician.  high blood sugar is very risky to your health.  you should see an eye doctor every year.  You are at higher than average risk for pneumonia and hepatitis-B.  You should be vaccinated against both.   controlling your blood pressure and cholesterol drastically reduces the damage diabetes does to your body.  this also applies to quitting smoking.  please discuss these with your doctor.  check your blood sugar 4 times a day: before the 3 meals, and at bedtime.  also check if you have symptoms of your blood sugar being too high or too low.  please keep a record of the readings and bring it to your next appointment here.  please call us sooner if your blood sugar goes below 70, or if you have a lot of readings over 200. Refer to a diabetes education specialist.  you will receive a phone call, about a day and time for an appointment.   Please change your insulin to the 2 types noted below.  i have sent a prescription to your pharmacy Please come back for a follow-up appointment in 1-2 weeks.

## 2012-10-25 NOTE — Progress Notes (Signed)
Subjective:    Patient ID: Taylor Warren, female    DOB: [birth year], 27 y.o.   MRN: [id number]  HPI pt states 8 years h/o dm.  she has mild if any neuropathy of the lower extremities; she is unaware of any associated chronic complications.  she has been on insulin since dx.  pt says her diet and exercise are not good.  pt states she feels well in general, except for dental problems.  She describes her cbg's as extremely variable.   Past Medical History  Diagnosis Date  . Diabetes mellitus without complication   . Anemia   . Urinary tract infection     pt still taking medication at this time    No past surgical history on file.  History   Social History  . Marital Status: Married    Spouse Name: N/A    Number of Children: N/A  . Years of Education: N/A   Occupational History  . Not on file.   Social History Main Topics  . Smoking status: Never Smoker   . Smokeless tobacco: Never Used  . Alcohol Use: No  . Drug Use: No  . Sexual Activity: Yes   Other Topics Concern  . Not on file   Social History Narrative  . No narrative on file    Current Outpatient Prescriptions on File Prior to Visit  Medication Sig Dispense Refill  . glucose blood test strip Use as instructed  100 each  12  . glucose monitoring kit (FREESTYLE) monitoring kit 1 each by Does not apply route as needed for other.  1 each  1  . insulin NPH-regular (NOVOLIN 70/30) (70-30) 100 UNIT/ML injection Inject 13 Units into the skin 3 (three) times daily before meals. 15 units breakfast and lunch. 20 units [place] in evening.  10 mL  3  . insulin regular (NOVOLIN R,HUMULIN R) 100 units/mL injection Inject 3 Units into the skin 3 (three) times daily before meals. Sliding scale       No current facility-administered medications on file prior to visit.    No Known Allergies  No family history on file. No DM BP 122/80  Pulse 90  Ht 5\' 5"  (1.651 m)  Wt 118 lb (53.524 kg)  BMI 19.64 kg/m2  SpO2 98%  Review  of Systems denies blurry vision, headache, chest pain, sob, n/v, excessive diaphoresis, memory loss rhinorrhea, and easy bruising.  She has lost 44 lbs x 2 years.  She has polyuria, depression, and muscle cramps.  She has reg menses.     Objective:   Physical Exam VS: see vs page GEN: no distress HEAD: head: no deformity eyes: no periorbital swelling, no proptosis external nose and ears are normal mouth: no lesion seen NECK: supple, thyroid is not enlarged CHEST WALL: no deformity LUNGS:  Clear to auscultation CV: reg rate and rhythm, no murmur ABD: abdomen is soft, nontender.  no hepatosplenomegaly.  not distended.  no hernia MUSCULOSKELETAL: muscle bulk and strength are grossly normal.  no obvious joint swelling.  gait is normal and steady EXTEMITIES: no deformity.  no ulcer on the feet.  feet are of normal color and temp.  no edema PULSES: dorsalis pedis intact bilat.  no carotid bruit NEURO:  cn 2-12 grossly intact.   readily moves all 4's.  sensation is intact to touch on the feet SKIN:  Normal texture and temperature.  No rash or suspicious lesion is visible.   NODES:  None palpable at the neck PSYCH: alert,  oriented x3.  Does not appear anxious nor depressed.    Lab Results  Component Value Date   HGBA1C 7.2% 10/10/2012      Assessment & Plan:  Type 1 DM: she needs increased rx.  This insulin regimen was chosen from multiple options, as it best matches his insulin to his changing requirements throughout the day.  The benefits of glycemic control must be weighed against the risks of hypoglycemia.   Depression: this often complicates the rx of DM Muscle cramps, possibly due to DM.

## 2012-10-26 ENCOUNTER — Ambulatory Visit: Payer: Medicaid Other | Attending: Family Medicine | Admitting: Internal Medicine

## 2012-10-26 VITALS — BP 118/80 | HR 80 | Temp 98.5°F | Resp 17 | Wt 116.4 lb

## 2012-10-26 DIAGNOSIS — E109 Type 1 diabetes mellitus without complications: Secondary | ICD-10-CM

## 2012-10-26 DIAGNOSIS — E119 Type 2 diabetes mellitus without complications: Secondary | ICD-10-CM

## 2012-10-26 DIAGNOSIS — H60399 Other infective otitis externa, unspecified ear: Secondary | ICD-10-CM

## 2012-10-26 DIAGNOSIS — H60393 Other infective otitis externa, bilateral: Secondary | ICD-10-CM

## 2012-10-26 DIAGNOSIS — Z3009 Encounter for other general counseling and advice on contraception: Secondary | ICD-10-CM

## 2012-10-26 MED ORDER — NEOMYCIN-POLYMYXIN-HC 1 % OT SOLN: 3.0000 [drp] | Freq: Three times a day (TID) | OTIC | Status: DC

## 2012-10-26 NOTE — Progress Notes (Signed)
Patient here for follow up Complains of bilateral ear pain  And dizzy

## 2012-10-26 NOTE — Progress Notes (Signed)
Patient ID: Taylor Warren, female   DOB: [birth year], 27 y.o.   MRN: [id number]  CC: Bilateral air pain  HPI: Patient came in today for bilateral air pain that started 3 days ago. No fever. No discharge. Patient is known to have diabetes mellitus with brittle control. She was seen yesterday by endocrinologist, and she has since begun new insulin regimen. She has no symptom of upper respiratory infection.  No Known Allergies Past Medical History  Diagnosis Date  . Diabetes mellitus without complication   . Anemia   . Urinary tract infection     pt still taking medication at this time   Current Outpatient Prescriptions on File Prior to Visit  Medication Sig Dispense Refill  . glucose blood test strip Use as instructed  100 each  12  . glucose monitoring kit (FREESTYLE) monitoring kit 1 each by Does not apply route as needed for other.  1 each  1  . insulin aspart (NOVOLOG FLEXPEN) 100 UNIT/ML SOPN FlexPen Inject 7 Units into the skin 3 (three) times daily with meals. And pen needles 4/day  5 pen  11  . Insulin Glargine (LANTUS SOLOSTAR) 100 UNIT/ML SOPN Inject 18 Units into the skin at bedtime.  5 pen  PRN   No current facility-administered medications on file prior to visit.   No family history on file. History   Social History  . Marital Status: Married    Spouse Name: N/A    Number of Children: N/A  . Years of Education: N/A   Occupational History  . Not on file.   Social History Main Topics  . Smoking status: Never Smoker   . Smokeless tobacco: Never Used  . Alcohol Use: No  . Drug Use: No  . Sexual Activity: Yes   Other Topics Concern  . Not on file   Social History Narrative  . No narrative on file    Review of Systems: Constitutional: Negative for fever, chills, diaphoresis, activity change, appetite change and fatigue. HENT: ++ ear pain, no nosebleeds, congestion, facial swelling, rhinorrhea, neck pain, neck stiffness and ear discharge.  Eyes: Negative for  pain, discharge, redness, itching and visual disturbance. Respiratory: Negative for cough, choking, chest tightness, shortness of breath, wheezing and stridor.  Cardiovascular: Negative for chest pain, palpitations and leg swelling. Gastrointestinal: Negative for abdominal distention. Genitourinary: Negative for dysuria, urgency, frequency, hematuria, flank pain, decreased urine volume, difficulty urinating and dyspareunia.  Musculoskeletal: Negative for back pain, joint swelling, arthralgias and gait problem. Neurological: Negative for dizziness, tremors, seizures, syncope, facial asymmetry, speech difficulty, weakness, light-headedness, numbness and headaches.  Hematological: Negative for adenopathy. Does not bruise/bleed easily. Psychiatric/Behavioral: Negative for hallucinations, behavioral problems, confusion, dysphoric mood, decreased concentration and agitation.    Objective:   Filed Vitals:   10/26/12 1723  BP: 118/80  Pulse: 80  Temp: 98.5 F (36.9 C)  Resp: 17    Physical Exam: Constitutional: Patient appears well-developed and well-nourished. No distress. HENT: Normocephalic, atraumatic, External right and left ear normal. Oropharynx is clear and moist.  Eyes: Conjunctivae and EOM are normal. PERRLA, no scleral icterus. Neck: Normal ROM. Neck supple. No JVD. No tracheal deviation. No thyromegaly. CVS: RRR, S1/S2 +, no murmurs, no gallops, no carotid bruit.  Pulmonary: Effort and breath sounds normal, no stridor, rhonchi, wheezes, rales.  Abdominal: Soft. BS +,  no distension, tenderness, rebound or guarding.  Musculoskeletal: Normal range of motion. No edema and no tenderness.  Lymphadenopathy: No lymphadenopathy noted, cervical, inguinal or axillary Neuro: Alert.  Normal reflexes, muscle tone coordination. No cranial nerve deficit. Skin: Skin is warm and dry. No rash noted. Not diaphoretic. No erythema. No pallor. Psychiatric: Normal mood and affect. Behavior, judgment,  thought content normal.  Lab Results  Component Value Date   WBC 10.3 10/10/2012   HGB 9.5* 10/10/2012   HCT 32.1* 10/10/2012   MCV 65.0* 10/10/2012   PLT 258 10/10/2012   Lab Results  Component Value Date   CREATININE 0.58 10/10/2012   BUN 15 10/10/2012   NA 135 10/10/2012   K 3.4* 10/10/2012   CL 101 10/10/2012   CO2 23 10/10/2012    Lab Results  Component Value Date   HGBA1C 7.2% 10/10/2012   Lipid Panel  No results found for this basename: chol, trig, hdl, cholhdl, vldl, ldlcalc       Assessment and plan:   Patient Active Problem List   Diagnosis Date Noted  . Otitis, externa, infective 10/26/2012  . Family planning initiation 10/26/2012  . 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 Dental caries 10/18/2012  . Family planning advice 10/18/2012  . Anemia, iron deficiency 09/26/2012  . Neck pain 09/25/2012  . Back pain 09/25/2012  . Hyperglycemia 09/25/2012  . Type I (juvenile type) diabetes mellitus without mention of complication, not stated as uncontrolled 09/18/2012  . UTI (urinary tract infection) 09/18/2012  . Left shoulder pain 09/18/2012   Cortisporin otic 3 drops into each ear every 6 hour Continue daily insulin regimen as prescribed by the endocrinologist Continue blood sugar check OB/GYN referral for family planning initiation  Taylor Warren was given clear instructions to go to ER or return to the clinic if symptoms don't improve, worsen or new problems develop.  Taylor Warren verbalized understanding.  Taylor Warren was told to call to get lab results if hasn't heard anything in the next week.        Jeanann Lewandowsky, MD [facility] [place], [place] [phone]   10/26/2012, 5:59 PM

## 2012-11-06 ENCOUNTER — Ambulatory Visit (INDEPENDENT_AMBULATORY_CARE_PROVIDER_SITE_OTHER): Payer: Medicaid Other | Admitting: Endocrinology

## 2012-11-06 ENCOUNTER — Encounter: Payer: Self-pay | Admitting: Endocrinology

## 2012-11-06 VITALS — BP 120/70 | HR 78 | Ht 65.0 in | Wt 115.0 lb

## 2012-11-06 DIAGNOSIS — E109 Type 1 diabetes mellitus without complications: Secondary | ICD-10-CM

## 2012-11-06 MED ORDER — GLUCOSE BLOOD VI STRP: 1.0000 | ORAL_STRIP | Freq: Four times a day (QID) | Status: DC

## 2012-11-06 MED ORDER — ACCU-CHEK AVIVA DEVI: 1.0000 | Freq: Once | Status: DC

## 2012-11-06 NOTE — Patient Instructions (Addendum)
check your blood sugar 4 times a day: before the 3 meals, and at bedtime.  also check if you have symptoms of your blood sugar being too high or too low.  please keep a record of the readings and bring it to your next appointment here.  please call us sooner if your blood sugar goes below 70, or if you have a lot of readings over 200. Please come back for a follow-up appointment in 2 weeks.  i have sent a prescription to your pharmacy, for a new meter and strips.

## 2012-11-06 NOTE — Progress Notes (Signed)
  Subjective:    Patient ID: Taylor Warren, female    DOB: 03-10-1985, 27 y.o.   MRN: [id number]  HPI pt states 8 years h/o dm.  she has mild if any neuropathy of the lower extremities; she is unaware of any associated chronic complications.  she has been on insulin since dx.  Pt says she does not have and glucose testing strips.  pt states she feels well in general. Past Medical History  Diagnosis Date  . Diabetes mellitus without complication   . Anemia   . Urinary tract infection     pt still taking medication at this time    No past surgical history on file.  History   Social History  . Marital Status: Married    Spouse Name: N/A    Number of Children: N/A  . Years of Education: N/A   Occupational History  . Not on file.   Social History Main Topics  . Smoking status: Never Smoker   . Smokeless tobacco: Never Used  . Alcohol Use: No  . Drug Use: No  . Sexual Activity: Yes   Other Topics Concern  . Not on file   Social History Narrative  . No narrative on file    Current Outpatient Prescriptions on File Prior to Visit  Medication Sig Dispense Refill  . insulin aspart (NOVOLOG FLEXPEN) 100 UNIT/ML SOPN FlexPen Inject 7 Units into the skin 3 (three) times daily with meals. And pen needles 4/day  5 pen  11  . Insulin Glargine (LANTUS SOLOSTAR) 100 UNIT/ML SOPN Inject 18 Units into the skin at bedtime.  5 pen  PRN  . NEOMYCIN-POLYMYXIN-HYDROCORTISONE (CORTISPORIN) 1 % SOLN otic solution Place 3 drops into both ears every 8 (eight) hours.  10 mL  0   No current facility-administered medications on file prior to visit.    No Known Allergies  No family history on file.  BP 120/70  Pulse 78  Ht 5\' 5"  (1.651 m)  Wt 115 lb (52.164 kg)  BMI 19.14 kg/m2  SpO2 98%  Review of Systems denies hypoglycemia    Objective:   Physical Exam VITAL SIGNS:  See vs page GENERAL: no distress PSYCH: Alert and oriented x 3.  Does not appear anxious nor depressed.        Assessment & Plan:  DM: therapy limited by lack of cbg strips.  i'll do the best i can.

## 2012-11-08 ENCOUNTER — Telehealth: Payer: Self-pay | Admitting: Emergency Medicine

## 2012-11-08 MED ORDER — NITROFURANTOIN MONOHYD MACRO 100 MG PO CAPS: 100.0000 mg | ORAL_CAPSULE | Freq: Two times a day (BID) | ORAL | Status: DC

## 2012-11-08 NOTE — Telephone Encounter (Signed)
Medication request from [facility] for Macrobid 100 mg capsules ordered by Dr. Elisabeth Pigeon but not on pt MAR profile

## 2012-11-08 NOTE — Addendum Note (Signed)
Addended by: Alison Murray on: 11/08/2012 11:26 AM   Modules accepted: Orders

## 2012-11-12 ENCOUNTER — Encounter: Payer: Medicaid Other | Attending: Endocrinology | Admitting: *Deleted

## 2012-11-12 ENCOUNTER — Encounter: Payer: Self-pay | Admitting: *Deleted

## 2012-11-12 VITALS — Ht 64.96 in | Wt 114.6 lb

## 2012-11-12 DIAGNOSIS — E109 Type 1 diabetes mellitus without complications: Secondary | ICD-10-CM | POA: Insufficient documentation

## 2012-11-12 DIAGNOSIS — Z713 Dietary counseling and surveillance: Secondary | ICD-10-CM | POA: Insufficient documentation

## 2012-11-12 NOTE — Progress Notes (Deleted)
Appt start time: 1045 end time:  1230.  Assessment:  Patient was seen on  11/12/12 for individual diabetes education. Patient speaks Arabic and is here with interpretor and her mother. She expressed concern over weight loss. 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 SMBG 5 times a day with reported range 107-400 mg/dl. States she moved to the [place] ago from [place]. She states she has been told she was fat and that she ate too much, so now she is restricting her food intake to keep from gaining weight. But now she weighs less than she wants to, but is having trouble eating appropriately.  Current HbA1c: 7.2%  MEDICATIONS: see list   DIETARY INTAKE:  Usual eating pattern includes 3 meals and 1-2 snacks per day.  Everyday foods include variety of all food groups.  Avoided foods include: none stated.    24-hr recall:  B (8 AM): 1 cup whole milk, 1 slice bread with cheese,   Snk ( AM): none due to high BG OR banana L ( PM): brings from home; 1 hot dog with bun,ketchup, occasionally mayo Snk (4 PM): eats with family, small serving meat, soup with vegetables, salad with olive oil, no starch, water D (8 PM): 2 slices frozen pizza,  Snk ( PM): 1 hour later milk, and banana Beverages: milk, water  Usual physical activity: not now  Estimated energy needs: 1400 calories 158 g carbohydrates 105 g protein 39 g fat  Progress Towards Goal(s):  In progress.   Nutritional Diagnosis:  NB-1.1 Food and nutrition-related knowledge deficit As related to diabetes control and weight management.  As evidenced by A1c of 7.2%.    Intervention:  Nutrition counseling provided.  Discussed diabetes disease process and treatment options.  Discussed physiology of diabetes and role of obesity on insulin resistance.  Encouraged moderate weight reduction to improve glucose levels.  Discussed role of medications and diet in glucose control  Provided education on macronutrients on glucose levels.  Provided  education on carb counting, importance of regularly scheduled meals/snacks, and meal planning  Discussed effects of physical activity on glucose levels and long-term glucose control.  Recommended she consider ideas of types of physical activity she might enjoy.  Reviewed patient medications.  Discussed role of medication on blood glucose and possible side effects  Discussed blood glucose monitoring and interpretation.  Discussed recommended target ranges and individual ranges.  At next visit, plan to discuss:   Short-term complications: hyper- and hypo-glycemia.  Discussed causes,symptoms, and treatment options.  Prevention, detection, and treatment of long-term complications.  Discussed the role of prolonged elevated glucose levels on body systems.  Role of stress on blood glucose levels and discussed strategies to manage psychosocial issues.  Recommendations for long-term diabetes self-care.  Established checklist for medical, dental, and emotional self-care.  Plan:  Aim for 3 Carb Choices per meal (45 grams) +/- 1 either way  Aim for 0-2 Carbs per snack if hungry  Consider ways  increasing your activity level daily as tolerated Continue checking BG at alternate times per day as directed by MD  Consider taking medication Lantus at 10 PM each night and Novolog at each meal as directed by MD Ask MD for Rx for Daryll Brod ketostix so you can test whenever you feel nauseated and BG is above 250 mg/dl  Handouts given during visit include: Living Well with Diabetes Carb Counting and Food Label handouts Meal Plan Card Insulin action handout  Barriers to learning/adherance to lifestyle  change: new to this country from [place]  Diabetes self-care support plan:   [facility] support group  Continued diabetes education  Monitoring/Evaluation:  Dietary intake, exercise, SMBG, and body weight in 4 week(s).

## 2012-11-12 NOTE — Patient Instructions (Addendum)
Plan:  Aim for 3 Carb Choices per meal (45 grams) +/- 1 either way  Aim for 0-2 Carbs per snack if hungry  Consider ways  increasing your activity level daily as tolerated Continue checking BG at alternate times per day as directed by MD  Consider taking medication Lantus at 10 PM each night and Novolog at each meal as directed by MD Ask MD for Rx for [facility] so you can test whenever you feel nauseated and BG is above 250 mg/dl

## 2012-11-15 ENCOUNTER — Ambulatory Visit: Payer: Medicaid Other | Attending: Family Medicine | Admitting: Internal Medicine

## 2012-11-15 ENCOUNTER — Encounter: Payer: Self-pay | Admitting: Internal Medicine

## 2012-11-15 VITALS — BP 106/75 | HR 94 | Temp 98.6°F | Ht 65.0 in | Wt 115.0 lb

## 2012-11-15 DIAGNOSIS — M549 Dorsalgia, unspecified: Secondary | ICD-10-CM

## 2012-11-15 DIAGNOSIS — Z794 Long term (current) use of insulin: Secondary | ICD-10-CM | POA: Insufficient documentation

## 2012-11-15 DIAGNOSIS — IMO0001 Reserved for inherently not codable concepts without codable children: Secondary | ICD-10-CM | POA: Insufficient documentation

## 2012-11-15 DIAGNOSIS — M542 Cervicalgia: Secondary | ICD-10-CM | POA: Insufficient documentation

## 2012-11-15 MED ORDER — ACETONE (URINE) TEST VI STRP: 1.0000 | ORAL_STRIP | Status: DC | PRN

## 2012-11-15 MED ORDER — GLUCOSE BLOOD VI STRP: 1.0000 | ORAL_STRIP | Freq: Every day | Status: DC

## 2012-11-15 NOTE — Progress Notes (Signed)
  Subjective:    Patient ID: Taylor Warren, female    DOB: 1985-11-25, 27 y.o.   MRN: [id number]  HPI  27 year old [place] female here with her mother and Arabic  interpreter. Patient reports having pain in her upper back for almost a year. He usually worsened with exertion and occasionally radiates to her shoulders. She had addressed these concern on her last visit and had x-ray of the cervical spine and shoulder it was normal. She denies any tingling or numbness of her extremities. She has been following with diabetic educator and outpatient endocrinology. She reports her blood sugar to be better now but needs for extra test strips. Denies headache, blurred vision, dizziness, fever, chills, nausea, vomiting, abdominal pain, bowel or urinary symptoms. Denies chest pain or palpitations, shortness of breath. Denies any change in her weight or appetite. She reports that she has been trying to conceive for almost a year without success. Has regular menstrual cycle but has cramping and menorrhagia.  Review of Systems As outlined in history of present illness    Objective:   Physical Exam Vitals reviewed Young female in no acute distress HEENT: No pallor, moist oral mucosa Chest: Clear to auscultation bilaterally CVS: Normal S1 and S2 Extremities: No Shoulder deformity , no vertebral tenderness. Mild tenderness to palpation over upper medial scapula b/l CNS: AAO x3       Assessment & Plan:  Uncontrolled diabetes Continue with current home dose of insulin. Has followup with the endocrinologist next week. I will give her test strips   upper back pain Appears musculoskeletal in nature. I have advised her on performing back stretching exercises as her work also involves bending for prolonged period of time. She can also use warm compress over the area at bedtime and take when necessary Tylenol for pain if needed. Recent x-ray unremarkable  Health maintenance Refer to GYN for Pap smear.  Patient also worried on not being able to conceive and can address during the visit

## 2012-11-20 ENCOUNTER — Ambulatory Visit (INDEPENDENT_AMBULATORY_CARE_PROVIDER_SITE_OTHER): Payer: Medicaid Other | Admitting: Endocrinology

## 2012-11-20 ENCOUNTER — Encounter: Payer: Self-pay | Admitting: Endocrinology

## 2012-11-20 VITALS — BP 120/74 | HR 78 | Ht 66.0 in | Wt 119.0 lb

## 2012-11-20 DIAGNOSIS — E109 Type 1 diabetes mellitus without complications: Secondary | ICD-10-CM

## 2012-11-20 NOTE — Patient Instructions (Addendum)
check your blood sugar 4 times a day: before the 3 meals, and at bedtime.  also check if you have symptoms of your blood sugar being too high or too low.  please keep a record of the readings and bring it to your next appointment here.  please call us sooner if your blood sugar goes below 70, or if you have a lot of readings over 200. Please come back for a follow-up appointment in 1 month. Please reduce the lantus to 17 units at bedtime. Please increase the novolog to 8 units 3 times a day (just before each meal).  However, if you are going to be active at work, take just 5 units.

## 2012-11-20 NOTE — Progress Notes (Signed)
  Subjective:    Patient ID: Taylor Warren, female    DOB: 1985-06-24, 27 y.o.   MRN: [id number]  HPI pt states 8 years h/o dm.  she has mild if any neuropathy of the lower extremities; she is unaware of any associated chronic complications.  she has been on insulin since dx.  pt states she feels well in general.  she brings a record of her cbg's which i have reviewed today.  She has mild hypoglycemia before breakfast, or with activity at work.  It is higher on weekends.   Past Medical History  Diagnosis Date  . Diabetes mellitus without complication   . Anemia   . Urinary tract infection     pt still taking medication at this time    No past surgical history on file.  History   Social History  . Marital Status: Married    Spouse Name: N/A    Number of Children: N/A  . Years of Education: N/A   Occupational History  . Not on file.   Social History Main Topics  . Smoking status: Never Smoker   . Smokeless tobacco: Never Used  . Alcohol Use: No  . Drug Use: No  . Sexual Activity: Yes   Other Topics Concern  . Not on file   Social History Narrative  . No narrative on file    Current Outpatient Prescriptions on File Prior to Visit  Medication Sig Dispense Refill  . glucose blood (ACCU-CHEK AVIVA) test strip 1 each by Other route 6 (six) times daily. And lancets 4/day 25.01  120 each  12   No current facility-administered medications on file prior to visit.   No Known Allergies  No family history on file.  BP 120/74  Pulse 78  Ht 5\' 6"  (1.676 m)  Wt 119 lb (53.978 kg)  BMI 19.22 kg/m2  SpO2 98%  Review of Systems Denies LOC and weight change.     Objective:   Physical Exam VITAL SIGNS:  See vs page GENERAL: no distress PSYCH: Alert and oriented x 3.  Does not appear anxious nor depressed.   (i reviewed cbg record)    Assessment & Plan:  DM: This insulin regimen was chosen from multiple options, as it best matches her insulin to her changing  requirements throughout the day.  The benefits of glycemic control must be weighed against the risks of hypoglycemia.  Pt is referred for pump training today, and seen by linda spagnola. Occupational situation: this is causes hypoglycemia.

## 2012-11-21 ENCOUNTER — Other Ambulatory Visit (INDEPENDENT_AMBULATORY_CARE_PROVIDER_SITE_OTHER): Payer: Medicaid Other

## 2012-11-21 DIAGNOSIS — E109 Type 1 diabetes mellitus without complications: Secondary | ICD-10-CM

## 2012-11-21 LAB — BASIC METABOLIC PANEL
BUN: 15 mg/dL (ref 6–23)
Calcium: 9.2 mg/dL (ref 8.4–10.5)
GFR: 141.84 mL/min (ref >60.00)
Glucose, Bld: 287 mg/dL — ABNORMAL HIGH (ref 70–99)
Potassium: 4.3 mEq/L (ref 3.5–5.1)

## 2012-11-21 NOTE — Addendum Note (Signed)
Addended by: Romero Belling on: 11/21/2012 11:58 AM   Modules accepted: Orders

## 2012-12-05 ENCOUNTER — Encounter (HOSPITAL_COMMUNITY): Payer: Self-pay | Admitting: Emergency Medicine

## 2012-12-05 ENCOUNTER — Emergency Department (HOSPITAL_COMMUNITY)
Admission: EM | Admit: 2012-12-05 | Discharge: 2012-12-05 | Disposition: A | Discharge status: Home / Self Care | Payer: Medicaid Other | Attending: Emergency Medicine | Admitting: Emergency Medicine

## 2012-12-05 DIAGNOSIS — Z862 Personal history of diseases of the blood and blood-forming organs and certain disorders involving the immune mechanism: Secondary | ICD-10-CM | POA: Insufficient documentation

## 2012-12-05 DIAGNOSIS — B373 Candidiasis of vulva and vagina: Secondary | ICD-10-CM

## 2012-12-05 DIAGNOSIS — E119 Type 2 diabetes mellitus without complications: Secondary | ICD-10-CM | POA: Insufficient documentation

## 2012-12-05 DIAGNOSIS — Z79899 Other long term (current) drug therapy: Secondary | ICD-10-CM | POA: Insufficient documentation

## 2012-12-05 DIAGNOSIS — M545 Low back pain, unspecified: Secondary | ICD-10-CM | POA: Insufficient documentation

## 2012-12-05 DIAGNOSIS — R110 Nausea: Secondary | ICD-10-CM | POA: Insufficient documentation

## 2012-12-05 DIAGNOSIS — Z794 Long term (current) use of insulin: Secondary | ICD-10-CM | POA: Insufficient documentation

## 2012-12-05 DIAGNOSIS — N390 Urinary tract infection, site not specified: Secondary | ICD-10-CM | POA: Insufficient documentation

## 2012-12-05 DIAGNOSIS — Z3202 Encounter for pregnancy test, result negative: Secondary | ICD-10-CM | POA: Insufficient documentation

## 2012-12-05 DIAGNOSIS — B3731 Acute candidiasis of vulva and vagina: Secondary | ICD-10-CM | POA: Insufficient documentation

## 2012-12-05 LAB — PREGNANCY, URINE: Preg Test, Ur: NEGATIVE

## 2012-12-05 LAB — URINE MICROSCOPIC-ADD ON

## 2012-12-05 LAB — URINALYSIS, ROUTINE W REFLEX MICROSCOPIC
Bilirubin Urine: NEGATIVE
Nitrite: NEGATIVE
Specific Gravity, Urine: 1.005 (ref 1.005–1.030)
Urobilinogen, UA: 0.2 mg/dL (ref 0.0–1.0)
pH: 7 (ref 5.0–8.0)

## 2012-12-05 LAB — POCT I-STAT, CHEM 8
HCT: 41 % (ref 36.0–46.0)
Hemoglobin: 13.9 g/dL (ref 12.0–15.0)
Potassium: 3.3 mEq/L — ABNORMAL LOW (ref 3.5–5.1)
Sodium: 141 mEq/L (ref 135–145)
TCO2: 23 mmol/L (ref 0–100)

## 2012-12-05 LAB — WET PREP, GENITAL: Trich, Wet Prep: NONE SEEN

## 2012-12-05 LAB — GLUCOSE, CAPILLARY: Glucose-Capillary: 75 mg/dL (ref 70–99)

## 2012-12-05 MED ORDER — POTASSIUM CHLORIDE CRYS ER 20 MEQ PO TBCR
40.0000 meq | EXTENDED_RELEASE_TABLET | Freq: Once | ORAL | Status: AC
  Administered 2012-12-05: 40 meq via ORAL
  Filled 2012-12-05: qty 2

## 2012-12-05 MED ORDER — FLUCONAZOLE 100 MG PO TABS
150.0000 mg | ORAL_TABLET | Freq: Once | ORAL | Status: AC
  Administered 2012-12-05: 150 mg via ORAL
  Filled 2012-12-05: qty 2

## 2012-12-05 MED ORDER — CEPHALEXIN 500 MG PO CAPS: 500.0000 mg | ORAL_CAPSULE | Freq: Four times a day (QID) | ORAL | Status: DC

## 2012-12-05 NOTE — ED Notes (Signed)
Pt. Given diet Sprite

## 2012-12-05 NOTE — ED Notes (Addendum)
Presents with 5 days of hematuria, dysuria and low back pain. Pain described as fire.  Denies fevers, denies chills.

## 2012-12-05 NOTE — ED Provider Notes (Signed)
CSN: [id number]     Arrival date & time 12/05/12  1622 History   First MD Initiated Contact with Patient 12/05/12 1712     This chart was scribed for Taylor Warren Taylor Warren - PA by Ladona Ridgel Day, ED scribe. This patient was seen in room TR08C/TR08C and the patient's care was started at 1622.  Chief Complaint  Patient presents with  . Hematuria   Patient is a 27 y.o. female presenting with hematuria. The history is provided by the patient, medical records, a parent and a significant other. A language interpreter was used.  Hematuria This is a new problem. The current episode started more than 2 days ago. The problem occurs constantly. The problem has not changed since onset.Pertinent negatives include no chest pain, no abdominal pain and no shortness of breath. Nothing aggravates the symptoms. Nothing relieves the symptoms. She has tried nothing for the symptoms.  Hematuria This is a new problem. The current episode started more than 2 days ago. The problem occurs constantly. The problem has not changed since onset.Associated symptoms include nausea. Pertinent negatives include no abdominal pain, chest pain, chills, fever, vomiting or weakness. Nothing aggravates the symptoms. She has tried nothing for the symptoms.   HPI Comments: Taylor Warren is a 27 y.o. female who presents to the Emergency Department complaining of hematuria/bilateral low back pain since last PM and dysuria for 3 days. She describes associated back pain as "fire" to her low back. She also reports large amount of vaginal d/c which is a new problem for her. She states nausea but denies emesis, fever/chills. She denies any previous similar episodes. She reports hx of DM which she takes insulin for x4/day. She takes Iron supplements as well for anemia. She denies any diarrhea, rash. She is sexually active and not using BC. Nothing makes her symptoms better or worse.  Past Medical History  Diagnosis Date  . Diabetes mellitus without  complication   . Anemia   . Urinary tract infection     pt still taking medication at this time   History reviewed. No pertinent past surgical history. History reviewed. No pertinent family history. History  Substance Use Topics  . Smoking status: Never Smoker   . Smokeless tobacco: Never Used  . Alcohol Use: No   OB History   Grav Para Term Preterm Abortions TAB SAB Ect Mult Living                 Review of Systems  Constitutional: Negative for fever and chills.  HENT: Negative for rhinorrhea.   Respiratory: Negative for shortness of breath.   Cardiovascular: Negative for chest pain.  Gastrointestinal: Positive for nausea. Negative for vomiting, abdominal pain and diarrhea.  Genitourinary: Positive for dysuria, hematuria and vaginal discharge (w/a strong odor).  Musculoskeletal: Positive for back pain (bilateral lower back pain).  Skin: Negative for color change.  Neurological: Negative for weakness.  All other systems reviewed and are negative.   A complete 10 system review of systems was obtained and all systems are negative except as noted in the HPI and PMH.   Allergies  Review of patient's allergies indicates no known allergies.  Home Medications   Current Outpatient Rx  Name  Route  Sig  Dispense  Refill  . glucose blood (ACCU-CHEK AVIVA) test strip   Other   1 each by Other route 6 (six) times daily. And lancets 4/day 25.01   120 each   12   . insulin aspart (NOVOLOG) 100 UNIT/ML SOPN  FlexPen   Subcutaneous   Inject 8 Units into the skin 3 (three) times daily with meals. And pen needles 4/day         . Insulin Glargine 100 UNIT/ML SOPN   Subcutaneous   Inject 17 Units into the skin at bedtime.         . cephALEXin (KEFLEX) 500 MG capsule   Oral   Take 1 capsule (500 mg total) by mouth 4 (four) times daily.   40 capsule   0    Triage Vitals: BP 109/73  Pulse 106  Temp(Src) 98.3 F (36.8 C) (Oral)  Resp 18  Ht 5' 4.96" (1.65 m)  Wt 116 lb  1.6 oz (52.663 kg)  BMI 19.34 kg/m2  SpO2 98%  LMP 11/14/2012 Physical Exam  Nursing note and vitals reviewed. Constitutional: She is oriented to person, place, and time. She appears well-developed and well-nourished.  HENT:  Head: Normocephalic and atraumatic.  Neck: Normal range of motion. Neck supple. No tracheal deviation present.  Cardiovascular: Normal rate, regular rhythm, normal heart sounds and intact distal pulses.   No murmur heard. Pulmonary/Chest: Effort normal and breath sounds normal. No respiratory distress. She has no decreased breath sounds. She has no wheezes. She has no rhonchi. She has no rales.  Abdominal: Soft. Normal appearance and bowel sounds are normal. She exhibits no distension and no mass. There is no tenderness. There is CVA tenderness. There is no rebound and no guarding. Hernia confirmed negative in the right inguinal area and confirmed negative in the left inguinal area.  Genitourinary: No labial fusion. There is no rash, tenderness, lesion or injury on the right labia. There is no rash, tenderness, lesion or injury on the left labia. Uterus is not deviated, not enlarged, not fixed and not tender. Cervix exhibits no motion tenderness, no discharge and no friability. Right adnexum displays no mass, no tenderness and no fullness. Left adnexum displays no mass, no tenderness and no fullness. No erythema, tenderness or bleeding around the vagina. No foreign body around the vagina. No signs of injury around the vagina. Vaginal discharge (thin, white, moderate amount, nonmalodorous) found.  Musculoskeletal: Normal range of motion. She exhibits tenderness.  CVA tenderness right worse than left.    Lymphadenopathy:       Right: No inguinal adenopathy present.       Left: No inguinal adenopathy present.  Neurological: She is alert and oriented to person, place, and time. She exhibits normal muscle tone. Coordination normal.  Skin: Skin is warm and dry. No rash noted. No  erythema.  Psychiatric: She has a normal mood and affect. Her behavior is normal.    ED Course  Procedures (including critical care time) DIAGNOSTIC STUDIES: Oxygen Saturation is 98% on room air, normal by my interpretation.    COORDINATION OF CARE: At 545 PM Discussed treatment plan with patient which includes UA, glucose level, wet prep, pelvic examination. Patient agrees.   Labs Review Labs Reviewed  WET PREP, GENITAL - Abnormal; Notable for the following:    Yeast Wet Prep HPF POC FEW (*)    WBC, Wet Prep HPF POC FEW (*)    All other components within normal limits  URINALYSIS, ROUTINE W REFLEX MICROSCOPIC - Abnormal; Notable for the following:    Color, Urine RED (*)    APPearance CLOUDY (*)    Hgb urine dipstick LARGE (*)    Protein, ur >300 (*)    Leukocytes, UA LARGE (*)    All other components  within normal limits  URINE MICROSCOPIC-ADD ON - Abnormal; Notable for the following:    Bacteria, UA MANY (*)    All other components within normal limits  POCT I-STAT, CHEM 8 - Abnormal; Notable for the following:    Potassium 3.3 (*)    Glucose, Bld 69 (*)    All other components within normal limits  GC/CHLAMYDIA PROBE AMP  URINE CULTURE  PREGNANCY, URINE  GLUCOSE, CAPILLARY  URINALYSIS, ROUTINE W REFLEX MICROSCOPIC   Imaging Review No results found.  MDM   1. UTI (lower urinary tract infection)   2. Vaginal yeast infection      Fonda Gulledge presents with hx and PE consistent with UTI.  Pt has been diagnosed with a UTI. Pt is afebrile, no CVA tenderness, normotensive, and denies N/V. Pt to be dc home with antibiotics and instructions to follow up with PCP if symptoms persist. Patient also with complaints of vaginal discharge.  Wet prep with evidence of yeast. Patient treated with Diflucan here in the emergency department. Mother expresses significant concern for patient's kidney function as she is a diabetic. I set Chem-8 with normal BUN and creatinine. Mild  hypokalemia noted. Repleted in the department.    It has been determined that no acute conditions requiring further emergency intervention are present at this time. The patient/guardian have been advised of the diagnosis and plan. We have discussed signs and symptoms that warrant return to the ED, such as changes or worsening in symptoms.   Vital signs are stable at discharge.   BP 113/77  Pulse 94  Temp(Src) 98.3 F (36.8 C) (Oral)  Resp 16  Ht 5' 4.96" (1.65 m)  Wt 116 lb 1.6 oz (52.663 kg)  BMI 19.34 kg/m2  SpO2 100%  LMP 11/14/2012  Patient/guardian has voiced understanding and agreed to follow-up with the PCP or specialist.   I personally performed the services described in this documentation, which was scribed in my presence. The recorded information has been reviewed and is accurate.   Taylor Warren Jametta Moorehead, PA-C 12/05/12 2041

## 2012-12-06 LAB — GC/CHLAMYDIA PROBE AMP
CT Probe RNA: NEGATIVE
GC Probe RNA: NEGATIVE

## 2012-12-07 LAB — URINE CULTURE

## 2012-12-07 NOTE — ED Provider Notes (Signed)
Medical screening examination/treatment/procedure(s) were performed by non-physician practitioner and as supervising physician I was immediately available for consultation/collaboration.   Charles B. Sheldon, MD 12/07/12 2027 

## 2012-12-08 ENCOUNTER — Telehealth (HOSPITAL_COMMUNITY): Payer: Self-pay | Admitting: Emergency Medicine

## 2012-12-08 NOTE — ED Notes (Signed)
Post ED Visit - Positive Culture Follow-up  Culture report reviewed by antimicrobial stewardship pharmacist: []  Wes Dulaney, Pharm.D., BCPS [x]  Celedonio Miyamoto, Pharm.D., BCPS []  Georgina Pillion, [address].D., BCPS []  [place], [address].D., BCPS, AAHIVP []  Estella Husk, Pharm.D., BCPS, AAHIVP  Positive urine culture Treated with Keflex, organism sensitive to the same and no further patient follow-up is required at this time.  [name] 12/08/2012, 3:12 PM

## 2012-12-12 ENCOUNTER — Encounter: Payer: Self-pay | Admitting: *Deleted

## 2012-12-12 ENCOUNTER — Encounter: Payer: Medicaid Other | Attending: Endocrinology | Admitting: *Deleted

## 2012-12-12 VITALS — Ht 65.0 in | Wt 115.2 lb

## 2012-12-12 DIAGNOSIS — E109 Type 1 diabetes mellitus without complications: Secondary | ICD-10-CM | POA: Insufficient documentation

## 2012-12-12 DIAGNOSIS — Z713 Dietary counseling and surveillance: Secondary | ICD-10-CM | POA: Insufficient documentation

## 2012-12-12 NOTE — Progress Notes (Signed)
Appt start time: 1600 end time:  1700.  Assessment:  Patient was seen on  11/12/12 for individual diabetes education follow up visit. Patient speaks Arabic and is here with interpretor and her sister who is a Holiday representative at [employer]. She is pleased with 1 pound weight gain over past month. Reports she did well for the 1st 2 weeks after our last appointment with Carb Counting, then she got sick with UTI and did not eat due to high BGs, so she had more variability with her control. Her sister reports she gets upset when shopping for clothes, stating that if they are too loose, she gets upset and goes home right away. She states she often feels sad inside when her BGs are too high and especially if she is spilling ketones.  In reading Dr. George Hugh notes, she was referred to see Cristy Folks, RN, CDE regarding insulin pump therapy. Patient agrees and is planning to order Accu Chek Spirit pump once approved by her insurance company.  She states she is comfortable with Carb Counting from last visit.  Current HbA1c: 7.2%  MEDICATIONS: see list   DIETARY INTAKE:  Usual eating pattern includes 3 meals and 1-2 snacks per day.  Everyday foods include variety of all food groups.  Avoided foods include: none stated.    24-hr recall:  B (8 AM): 1 cup whole milk, 1 slice bread with cheese,   Snk ( AM): none due to high BG OR banana L ( PM): brings from home; 1 hot dog with bun,ketchup, occasionally mayo Snk (4 PM): eats with family, small serving meat, soup with vegetables, salad with olive oil, no starch, water D (8 PM): 2 slices frozen pizza,  Snk ( PM): 1 hour later milk, and banana Beverages: milk, water  Usual physical activity: not now  Estimated energy needs: 1400 calories 158 g carbohydrates 105 g protein 39 g fat  Progress Towards Goal(s):  In progress.   Nutritional Diagnosis:  NB-1.1 Food and nutrition-related knowledge deficit As related to diabetes control and weight  management.  As evidenced by A1c of 7.2%.    Intervention:  Nutrition counseling provided.  Reviewed Carb Counting briefly  Reviewed causes, prevention and treatment for DKA  Reviewed transition from injections with set doses at meal time to what pump therapy can provide including the use of Carb Ratio and Insulin Sensitivity Factor so high BGs can be addressed accurately.  Introduced her to Denny Levy, RD who can work with her on disordered eating. She was very interested in pursuing help in this area and requested a 2 week appointment instead of 4 weeks.  Plan:  Continue to aim for 3 Carb Choices per meal (45 grams) +/- 1 either way  Continue to aim for 0-2 Carbs per snack if hungry  Continue checking BG at alternate times per day as directed by MD  Continue taking medication Lantus at 10 PM each night and Novolog at each meal as directed by MD Ask MD for Rx for [facility] so you can test whenever you feel nauseated and BG is above 250 mg/dl  Handouts given during visit include: Intro to Pumping Handout for her information  Barriers to learning/adherance to lifestyle change: new to this country from [place], impaired body image   Diabetes self-care support plan:   [facility] support group, informed her of Type 1 Support Group that is forming  Continued diabetes education  Monitoring/Evaluation:  Dietary intake, exercise, SMBG, and body weight in 2  weeks with Denny Levy

## 2012-12-12 NOTE — Patient Instructions (Signed)
Plan:  Continue to aim for 3 Carb Choices per meal (45 grams) +/- 1 either way  Continue to aim for 0-2 Carbs per snack if hungry  Continue checking BG at alternate times per day as directed by MD  Continue taking medication Lantus at 10 PM each night and Novolog at each meal as directed by MD Ask MD for Rx for [facility] so you can test whenever you feel nauseated and BG is above 250 mg/dl

## 2012-12-19 NOTE — Progress Notes (Signed)
Appt start time: 16109 end time:  1230.  Assessment: Patient was seen on 11/12/12 for individual diabetes education. Patient speaks Arabic and is here with interpretor and her mother. She expressed concern over weight loss. 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 SMBG 5 times a day with reported range 107-400 mg/dl. States she [place] the [place] ago from [place]. She states she has been told she was fat and that she ate too much, so now she is restricting her food intake to keep from gaining weight. But now she weighs less than she wants to, but is having trouble eating appropriately.  Current HbA1c: 7.2%  MEDICATIONS: see list  DIETARY INTAKE:  Usual eating pattern includes 3 meals and 1-2 snacks per day.  Everyday foods include variety of all food groups. Avoided foods include: none stated.  24-hr recall:  B (8 AM): 1 cup whole milk, 1 slice bread with cheese,  Snk ( AM): none due to high BG OR banana  L ( PM): brings from home; 1 hot dog with bun,ketchup, occasionally mayo  Snk (4 PM): eats with family, small serving meat, soup with vegetables, salad with olive oil, no starch, water  D (8 PM): 2 slices frozen pizza,  Snk ( PM): 1 hour later milk, and banana  Beverages: milk, water  Usual physical activity: not now  Estimated energy needs:  1400 calories  158 g carbohydrates  105 g protein  39 g fat  Progress Towards Goal(s): In progress.  Nutritional Diagnosis:  NB-1.1 Food and nutrition-related knowledge deficit As related to diabetes control and weight management. As evidenced by A1c of 7.2%.   Intervention: Nutrition counseling provided. (previous notes incorrect, this is more accurate description of intervention for that visit.) Discussed diabetes disease process and treatment options. Discussed physiology of diabetes and the difference between Type 1 and Type 2. Discussed role of medications and food in glucose control  Provided education on macronutrients on  glucose levels. Provided education on carb counting, and rationale of spreading carb containnig foods fairly evenly throughout the day. Due to her concern over weight loss, we did not discuss effects of physical activity on glucose levels  Reviewed patient medications. Discussed role and action time of her 2 types of insulin on blood glucose and possible side effects  Discussed blood glucose monitoring and interpretation. Discussed recommended target ranges and individual ranges.  Short-term complications: hyper- and hypo-glycemia. Discussed causes,symptoms, and treatment options.  Provided information on:  Prevention, detection, and treatment of long-term complications. Discussed the role of prolonged elevated glucose levels on body systems.  Role of stress on blood glucose levels and discussed strategies to manage psychosocial issues.  Recommendations for long-term diabetes self-care. Established checklist for medical, dental, and emotional self-care.  Plan:  Aim for 3 Carb Choices per meal (45 grams) +/- 1 either way  Aim for 0-2 Carbs per snack if hungry  Continue checking BG at alternate times per day as directed by MD  Consider taking medication Lantus at 10 PM each night and Novolog at each meal as directed by MD  Ask MD for Rx for [facility] so you can test whenever you feel nauseated and BG is above 250 mg/dl   Handouts given during visit include:  Living Well with Diabetes  Carb Counting and Food Label handouts  Meal Plan Card  Insulin action handout  Barriers to learning/adherance to lifestyle change: new to this country from [place]   Diabetes self-care support  plan:  [facility] support group  Continued diabetes education  Monitoring/Evaluation: Dietary intake, SMBG, and body weight in 4 week(s).

## 2012-12-27 ENCOUNTER — Encounter: Payer: Self-pay | Admitting: *Deleted

## 2012-12-27 ENCOUNTER — Encounter: Payer: Medicaid Other | Admitting: *Deleted

## 2012-12-27 VITALS — Ht 65.0 in | Wt 113.6 lb

## 2012-12-27 DIAGNOSIS — R638 Other symptoms and signs concerning food and fluid intake: Secondary | ICD-10-CM

## 2012-12-27 DIAGNOSIS — E109 Type 1 diabetes mellitus without complications: Secondary | ICD-10-CM

## 2012-12-27 DIAGNOSIS — R627 Adult failure to thrive: Secondary | ICD-10-CM

## 2012-12-27 NOTE — Progress Notes (Signed)
Medical Nutrition Therapy:  Appt start time: 1630 end time:  1730.  Assessment:  Primary concerns today: Taylor Warren is here for nutrition counseling pertaining to disordered body image and disordered eating.  She was diagnosed with type 1 diabetes about 8 years ago and is working with Jenita Seashore, RD CDE on her diabetes management.  Bev noticed some comments that were concerning about body image and disordered eating. When Taylor Warren was first diagnosed with diabetes, she followed a very strict diet and increased her exercise so that she would be in cool glycemia control.  She lost 17 kg in 2 months.  She weighed 65 when she was diagnosed (143 pounds) and then lost down to 105 pounds which was not a healthy weight.  She became very depressed at that time.  She was not in good glucose control for many years.  She was not able to go out alone in [place] and didn't have medical care for 8 years.  She moved to the [place] 4 months ago and is working with Dr. Everardo All for her diabetes management.  She currently has a UTI and her glucose is elevated.  Her current HbA1c is 7.2%  She is very close with her family.  They live for the most part together.  They share shopping and cooking responsibilities.     Primary care MD: [facility] Therapist: none Any other medical team members: Romero Belling, endocrinology  Assessment Weight: 113 lb Height: 65 in Expected body weight: 120 + 12 pounds Percent expected body weight: 86%  Eating history:  Length of time: 8 years Previous treatments: none Goals for RD meetings: learn to have healthy relationship with food and still manage diabetes  Weight history:  Highest weight: 145 lb   Lowest weight: 103 lb Most consistent weight: 105  What would you like to weigh:132 How has weight changed in the past year: changes from 103-115 pounds  Medical Information:  Changes in hair, skin, nails since ED started: hair loss and her nails don't grow  much Chewing/swallowing difficulties: not currently Relux or heartburn:none Trouble with teeth: none LMP without the use of hormones: 2 weeks ago. Regular cycles and trying to conceive currently Effect of exercise on menses : stopped    Constipation, diarrhea: none  Mental health diagnosis: never worked with a therapist   Dietary assessment  A typical day consists of 2 meals and 0 snacks  Avoided foods include: alcohol and pork.  Loves sweets, but tries to limit them  24 hour recall:  B: whole milk and banana D: rice with chicken or soup with vegetables Sometimes eats bread as snack Sometimes hot dog or pizza,.  Sometimes sandwich Beverages: water, sometimes soda, coffee.  Compensatory behaviors           Restricting (calories, fat, carbs): 45g carbohydrate/day; sometimes forgets to eat or gets depressed and doesn't eat  Alcohol or drugs: none  Exercise (what type): sometimes walks for 30 minutes  Food rules or rituals (explain): none  Binge: none  Nutrition Diagnosis: NI-1.6 Predicted suboptional energy  As related to restricted diet .  As evidenced by involuntary weight loss.  Intervention/Goals: Discussed physiologic changes associated with severe calorie restriction. aim for 3 meals a day and 45 g carbohydrate/meal.  Include 3-5 ounces protein to each meal; vegetables to lunch and dinner; and use olive oil in cooking.  Aim for 0.5-1 pound gain per week  Monitoring and Evaluation: Patient will follow up in 3 weeks.  Will evaluate  need for therapist

## 2012-12-27 NOTE — Patient Instructions (Signed)
Aim for 3 meal each day 45 grams carbohydrate per meal  B: milk, banana, pita with eggs and cheese; cereal or oatmeal with fruit and milk.  Pancake with sugar-free syrup or peanut butter L: rice 1 cup with chicken or  and vegetables.  2/3 cup rice and fruit with chicken and vegetables D: pita with meat and vegetable or rice or potato with meat and vegetable or pizza

## 2012-12-28 ENCOUNTER — Ambulatory Visit (INDEPENDENT_AMBULATORY_CARE_PROVIDER_SITE_OTHER): Payer: Medicaid Other | Admitting: Endocrinology

## 2012-12-28 ENCOUNTER — Encounter: Payer: Self-pay | Admitting: Endocrinology

## 2012-12-28 VITALS — BP 112/68 | HR 107 | Temp 98.6°F | Resp 12 | Wt 113.7 lb

## 2012-12-28 DIAGNOSIS — E109 Type 1 diabetes mellitus without complications: Secondary | ICD-10-CM

## 2012-12-28 NOTE — Progress Notes (Signed)
  Subjective:    Patient ID: Taylor Warren, female    DOB: 07/16/1985, 27 y.o.   MRN: [id number]  HPI pt returns for f/u of type 1 DM (dx'ed 2006;  she has mild if any neuropathy of the lower extremities; she is unaware of any associated chronic complications; she has been on insulin since dx).  She recently saw her PCP for "stomach infection," which she has had x 1 week. no cbg record, but states cbg's vary from 70-500.  It is highest at hs and am, and lowest in the afternoon (if she is active).  However, she says cbg's were well-controlled prior to the infection.   Past Medical History  Diagnosis Date  . Diabetes mellitus without complication   . Anemia   . Urinary tract infection     pt still taking medication at this time    History reviewed. No pertinent past surgical history.  History   Social History  . Marital Status: Married    Spouse Name: N/A    Number of Children: N/A  . Years of Education: N/A   Occupational History  . Not on file.   Social History Main Topics  . Smoking status: Never Smoker   . Smokeless tobacco: Never Used  . Alcohol Use: No  . Drug Use: No  . Sexual Activity: Yes   Other Topics Concern  . Not on file   Social History Narrative  . No narrative on file    Current Outpatient Prescriptions on File Prior to Visit  Medication Sig Dispense Refill  . cephALEXin (KEFLEX) 500 MG capsule Take 1 capsule (500 mg total) by mouth 4 (four) times daily.  40 capsule  0  . ferrous sulfate 325 (65 FE) MG tablet Take 325 mg by mouth daily with breakfast.      . glucose blood (ACCU-CHEK AVIVA) test strip 1 each by Other route 6 (six) times daily. And lancets 4/day 25.01  120 each  12  . insulin aspart (NOVOLOG) 100 UNIT/ML SOPN FlexPen Inject 8 Units into the skin 3 (three) times daily with meals. And pen needles 4/day      . Insulin Glargine 100 UNIT/ML SOPN Inject 17 Units into the skin at bedtime.       No current facility-administered medications on  file prior to visit.    No Known Allergies  History reviewed. No pertinent family history.  BP 112/68  Pulse 107  Temp(Src) 98.6 F (37 C) (Oral)  Resp 12  Wt 113 lb 11.2 oz (51.574 kg)  BMI 18.92 kg/m2  SpO2 98%  LMP 11/14/2012  Review of Systems denies hypoglycemia.  She has lost weight.    Objective:   Physical Exam VITAL SIGNS:  See vs page GENERAL: no distress  Lab Results  Component Value Date   HGBA1C 7.2% 10/10/2012      Assessment & Plan:  DM: This insulin regimen was chosen from multiple options, as it best matches her insulin to her changing requirements throughout the day.  The benefits of glycemic control must be weighed against the risks of hypoglycemia.  She needs increased rx. GI sxs, uncertain etiology.  These are complicating the rx of her DM Weight loss, uncertain etiology.

## 2012-12-28 NOTE — Patient Instructions (Addendum)
check your blood sugar 4 times a day: before the 3 meals, and at bedtime.  also check if you have symptoms of your blood sugar being too high or too low.  please keep a record of the readings and bring it to your next appointment here.  please call us sooner if your blood sugar goes below 70, or if you have a lot of readings over 200. Please come back for a follow-up appointment in 1 month. Please continue the lantus, 17 units at bedtime. Please continue the novolog, 8 units 3 times a day (just before each meal).  However, if you are going to be active at work, take just 5 units.  Also, take 2 extra units any time your blood sugar is over 250.  This insulin is taken just before you eat, and only when you eat.

## 2013-01-16 ENCOUNTER — Telehealth: Payer: Self-pay | Admitting: Internal Medicine

## 2013-01-16 NOTE — Telephone Encounter (Signed)
Patient would like a referral to a different gyn. She would like to go to [facility] [place] [phone]

## 2013-01-22 ENCOUNTER — Ambulatory Visit (INDEPENDENT_AMBULATORY_CARE_PROVIDER_SITE_OTHER): Payer: Medicaid Other | Admitting: Endocrinology

## 2013-01-22 ENCOUNTER — Encounter: Payer: Self-pay | Admitting: Endocrinology

## 2013-01-22 ENCOUNTER — Encounter: Payer: Medicaid Other | Attending: Endocrinology | Admitting: *Deleted

## 2013-01-22 VITALS — BP 114/66 | HR 80 | Temp 98.3°F | Resp 12 | Ht 65.5 in | Wt 111.4 lb

## 2013-01-22 VITALS — Ht 65.0 in | Wt 112.2 lb

## 2013-01-22 DIAGNOSIS — F509 Eating disorder, unspecified: Secondary | ICD-10-CM

## 2013-01-22 DIAGNOSIS — E109 Type 1 diabetes mellitus without complications: Secondary | ICD-10-CM

## 2013-01-22 DIAGNOSIS — N390 Urinary tract infection, site not specified: Secondary | ICD-10-CM

## 2013-01-22 DIAGNOSIS — Z713 Dietary counseling and surveillance: Secondary | ICD-10-CM | POA: Insufficient documentation

## 2013-01-22 LAB — URINALYSIS, ROUTINE W REFLEX MICROSCOPIC
Bilirubin Urine: NEGATIVE
Ketones, ur: NEGATIVE
Total Protein, Urine: 30
Urine Glucose: 500
pH: 5.5 (ref 5.0–8.0)

## 2013-01-22 LAB — HEMOGLOBIN A1C: Hgb A1c MFr Bld: 9.1 % — ABNORMAL HIGH (ref 4.6–6.5)

## 2013-01-22 MED ORDER — CIPROFLOXACIN HCL 500 MG PO TABS: 500.0000 mg | ORAL_TABLET | Freq: Two times a day (BID) | ORAL | Status: DC

## 2013-01-22 NOTE — Patient Instructions (Addendum)
blood and urine tests are being requested for you today.  We'll contact you with results. i have sent a prescription to your pharmacy, for an antibiotic. I hope you feel better soon.  If you don't feel better by next week, please call your regular doctor.  Please call sooner if you get worse. check your blood sugar 4 times a day: before the 3 meals, and at bedtime.  also check if you have symptoms of your blood sugar being too high or too low.  please keep a record of the readings and bring it to your next appointment here.  please call us sooner if your blood sugar goes below 70, or if you have a lot of readings over 200. Please come back for a follow-up appointment in 3 months. Please continue the lantus, 17 units at bedtime. Please continue the novolog, 8 units 3 times a day (just before each meal).  However, if you are going to be active, take just 5 units.  Also, take 2 extra units any time your blood sugar is over 250.  This insulin is taken just before you eat, and only when you eat.

## 2013-01-22 NOTE — Progress Notes (Signed)
01/22/13  Taylor Warren is here with her sister for nutrition follow up.  She reports having an abdominal infection for a month.  She is in pain.  Her blood glucose this morning read 600 mg/dl.  She reports not eating because she is scared her glucose will rise too much.  She has lost 2 pounds since last visit.  I have referred her to Taylor Warren for assessment.  We were able to schedule her an appointment with him at 1pm today.  If her glucose stays this elevated she will need to go to the emergency department.  I have  Asked her to follow up with me ASAP.  Unfortunetly, out offices will be closed Thursday and Friday of this week so I will not be available for consult until Monday.  She reports burning sensation with urination and itchy skin.    We discussed the need to eat, despite her blood glucose.  I suggested proteins and vegetables for now, but emphasized the need for carbs in the future. I also encouraged her to consume adequate sugar-free and caffeine-free beverages. I gave her a meal plan card and scheduled a follow up visit with me in 2 weeks.

## 2013-01-22 NOTE — Progress Notes (Signed)
  Subjective:    Patient ID: Taylor Warren, female    DOB: 13-Apr-1985, 27 y.o.   MRN: [id number]  HPI Pt states a few weeks of moderate pain at the urethra, in the context of urination.  She has assoc hematuria.  LMP was 2 weeks ago.   pt returns for f/u of type 1 DM (dx'ed 2006;  she has mild if any neuropathy of the lower extremities; she is unaware of any associated chronic complications; she has been on insulin since dx).  She has mild hypoglycemia approx once a month, with activity, in the afternoon.  Otherwise, cbg's are over 200 at hs, and in am.   Past Medical History  Diagnosis Date  . Diabetes mellitus without complication   . Anemia   . Urinary tract infection     pt still taking medication at this time    No past surgical history on file.  History   Social History  . Marital Status: Married    Spouse Name: N/A    Number of Children: N/A  . Years of Education: N/A   Occupational History  . Not on file.   Social History Main Topics  . Smoking status: Never Smoker   . Smokeless tobacco: Never Used  . Alcohol Use: No  . Drug Use: No  . Sexual Activity: Yes   Other Topics Concern  . Not on file   Social History Narrative  . No narrative on file    Current Outpatient Prescriptions on File Prior to Visit  Medication Sig Dispense Refill  . ferrous sulfate 325 (65 FE) MG tablet Take 325 mg by mouth daily with breakfast.      . glucose blood (ACCU-CHEK AVIVA) test strip 1 each by Other route 6 (six) times daily. And lancets 4/day 25.01  120 each  12  . insulin aspart (NOVOLOG) 100 UNIT/ML SOPN FlexPen Inject 8 Units into the skin 3 (three) times daily with meals. And pen needles 4/day      . Insulin Glargine 100 UNIT/ML SOPN Inject 17 Units into the skin at bedtime.       No current facility-administered medications on file prior to visit.    No Known Allergies  No family history on file.  BP 114/66  Pulse 80  Temp(Src) 98.3 F (36.8 C) (Oral)  Resp 12   Ht 5' 5.5" (1.664 m)  Wt 111 lb 6.4 oz (50.531 kg)  BMI 18.25 kg/m2  LMP 01/08/2013   Review of Systems Denies fever and LOC    Objective:   Physical Exam VITAL SIGNS:  See vs page GENERAL: no distress Abd: moderate suprapubic tenderness.      Assessment & Plan:  DM: This insulin regimen was chosen from multiple options, as it best matches her insulin to her changing requirements throughout the day.  The benefits of glycemic control must be weighed against the risks of hypoglycemia.  Based on the pattern of her cbg's, she needs some adjustment in her therapy UTI, recurrent

## 2013-01-24 LAB — CULTURE, URINE COMPREHENSIVE: Colony Count: >=100000

## 2013-01-28 NOTE — Telephone Encounter (Signed)
Pt is going to contact [place] to make an appt. She has regular medicaid don't need a referral

## 2013-02-07 ENCOUNTER — Ambulatory Visit: Payer: Medicaid Other | Admitting: *Deleted

## 2013-02-18 ENCOUNTER — Telehealth: Payer: Self-pay | Admitting: *Deleted

## 2013-03-12 ENCOUNTER — Ambulatory Visit: Payer: Medicaid Other | Attending: Internal Medicine

## 2013-03-12 VITALS — BP 116/78 | HR 68 | Temp 98.2°F | Resp 16 | Wt 110.8 lb

## 2013-03-12 DIAGNOSIS — N926 Irregular menstruation, unspecified: Secondary | ICD-10-CM

## 2013-03-12 LAB — POCT URINE PREGNANCY: Preg Test, Ur: NEGATIVE

## 2013-03-12 NOTE — Progress Notes (Unsigned)
Patient is a walk in for pregnancy test Last period greater than two weeks ago

## 2013-03-13 LAB — HCG, QUANTITATIVE, PREGNANCY

## 2013-03-14 ENCOUNTER — Telehealth: Payer: Self-pay | Admitting: Internal Medicine

## 2013-03-14 ENCOUNTER — Telehealth: Payer: Self-pay | Admitting: *Deleted

## 2013-03-14 NOTE — Telephone Encounter (Signed)
Pt calling again about pregnancy test results. Please f/u with pt.

## 2013-03-14 NOTE — Telephone Encounter (Signed)
Pt calling results for pregnancy test. Please f/u with pt.

## 2013-03-14 NOTE — Telephone Encounter (Signed)
Pt called requesting results from her pregnancy test. I informed her that the test was negative

## 2013-04-04 ENCOUNTER — Ambulatory Visit: Payer: Medicaid Other | Admitting: Internal Medicine

## 2013-04-04 ENCOUNTER — Encounter: Payer: Self-pay | Admitting: Internal Medicine

## 2013-04-04 ENCOUNTER — Ambulatory Visit: Payer: Medicaid Other | Attending: Internal Medicine | Admitting: Internal Medicine

## 2013-04-04 VITALS — BP 117/82 | HR 106 | Temp 98.0°F | Resp 16 | Ht 64.96 in | Wt 114.0 lb

## 2013-04-04 DIAGNOSIS — N898 Other specified noninflammatory disorders of vagina: Secondary | ICD-10-CM | POA: Insufficient documentation

## 2013-04-04 DIAGNOSIS — R300 Dysuria: Secondary | ICD-10-CM

## 2013-04-04 DIAGNOSIS — N912 Amenorrhea, unspecified: Secondary | ICD-10-CM

## 2013-04-04 DIAGNOSIS — Z3201 Encounter for pregnancy test, result positive: Secondary | ICD-10-CM

## 2013-04-04 DIAGNOSIS — Z794 Long term (current) use of insulin: Secondary | ICD-10-CM | POA: Insufficient documentation

## 2013-04-04 DIAGNOSIS — Z79899 Other long term (current) drug therapy: Secondary | ICD-10-CM | POA: Insufficient documentation

## 2013-04-04 DIAGNOSIS — N390 Urinary tract infection, site not specified: Secondary | ICD-10-CM | POA: Insufficient documentation

## 2013-04-04 DIAGNOSIS — E119 Type 2 diabetes mellitus without complications: Secondary | ICD-10-CM | POA: Insufficient documentation

## 2013-04-04 LAB — POCT URINALYSIS DIPSTICK
BILIRUBIN UA: NEGATIVE
Blood, UA: NEGATIVE
GLUCOSE UA: 500
Ketones, UA: NEGATIVE
Leukocytes, UA: NEGATIVE
NITRITE UA: NEGATIVE
Protein, UA: NEGATIVE
Spec Grav, UA: 1.025
Urobilinogen, UA: 0.2
pH, UA: 5.5

## 2013-04-04 LAB — POCT URINE PREGNANCY: PREG TEST UR: POSITIVE

## 2013-04-04 NOTE — Progress Notes (Signed)
Pt is here following up on her diabetes. Pt thinks that she may be pregnant. Pt is needing a referral to an obgyn. Pt back pain is causing her to have trouble walking and sleeping. Pt has a Nurse, learning disabilitytranslator

## 2013-04-04 NOTE — Progress Notes (Signed)
MRN: [id number] Name: Taylor ScotFatimah Warren  Sex: female Age: 28 y.o. DOB: 11/24/1985  Allergies: Review of patient's allergies indicates no known allergies.  Chief Complaint  Patient presents with  . Follow-up    HPI: Patient is 28 y.o. female who had UTI 2 months ago urine culture showed Escherichia coli which was sensitive to Cipro and she was treated with Cipro, patient who wants her to be checked if she doesn't have another urinary infection since she had some vaginal discharge and urine burning sensation, and also she reported her to have memory of for the last 2 months also wants to be checked if she is not pregnant denies any fever chills chest pain shortness of breath, she has history of diabetes and is following with endocrinologist.  Past Medical History  Diagnosis Date  . Diabetes mellitus without complication   . Anemia   . Urinary tract infection     pt still taking medication at this time    History reviewed. No pertinent past surgical history.    Medication List       This list is accurate as of: 04/04/13 12:48 PM.  Always use your most recent med list.               ciprofloxacin 500 MG tablet  Commonly known as:  CIPRO  Take 1 tablet (500 mg total) by mouth 2 (two) times daily.     ferrous sulfate 325 (65 FE) MG tablet  Take 325 mg by mouth daily with breakfast.     glucose blood test strip  Commonly known as:  ACCU-CHEK AVIVA  1 each by Other route 6 (six) times daily. And lancets 4/day 25.01     insulin aspart 100 UNIT/ML FlexPen  Commonly known as:  NOVOLOG  Inject 8 Units into the skin 3 (three) times daily with meals. And pen needles 4/day     Insulin Glargine 100 UNIT/ML Solostar Pen  Commonly known as:  LANTUS  Inject 17 Units into the skin at bedtime.        No orders of the defined types were placed in this encounter.    Immunization History  Administered Date(s) Administered  . Influenza Split 12/28/2012    History reviewed. No  pertinent family history.  History  Substance Use Topics  . Smoking status: Never Smoker   . Smokeless tobacco: Never Used  . Alcohol Use: No    Review of Systems  As noted in HPI  Filed Vitals:   04/04/13 1216  BP: 117/82  Pulse: 106  Temp: 98 F (36.7 C)  Resp: 16    Physical Exam  Physical Exam  Constitutional: No distress.  Cardiovascular: Normal rate.   Pulmonary/Chest: Breath sounds normal. No respiratory distress. She has no wheezes. She has no rales.  Abdominal: Soft. There is no tenderness. There is no rebound.    CBC    Component Value Date/Time   WBC 10.3 10/10/2012 0423   RBC 4.94 10/10/2012 0423   HGB 13.9 12/05/2012 1824   HCT 41.0 12/05/2012 1824   PLT 258 10/10/2012 0423   MCV 65.0* 10/10/2012 0423   LYMPHSABS 1.3 10/10/2012 0423   MONOABS 0.7 10/10/2012 0423   EOSABS 0.2 10/10/2012 0423   BASOSABS 0.0 10/10/2012 0423    CMP     Component Value Date/Time   NA 141 12/05/2012 1824   K 3.3* 12/05/2012 1824   CL 103 12/05/2012 1824   CO2 25 11/21/2012 0821   GLUCOSE 69* 12/05/2012 1824  BUN 10 12/05/2012 1824   CREATININE 1.00 12/05/2012 1824   CREATININE 0.71 09/25/2012 1802   CALCIUM 9.2 11/21/2012 0821   PROT 6.7 09/25/2012 1802   ALBUMIN 4.2 09/25/2012 1802   AST 22 09/25/2012 1802   ALT 18 09/25/2012 1802   ALKPHOS 45 09/25/2012 1802   BILITOT 0.5 09/25/2012 1802   GFRNONAA >90 10/10/2012 0423   GFRAA >90 10/10/2012 0423    No results found for this basename: chol, tri, ldl    No components found with this basename: hga1c    Lab Results  Component Value Date/Time   AST 22 09/25/2012  6:02 PM    Assessment and Plan  Dysuria - Plan: POCT urinalysis dipstick negative for leukocyte esterase or nitrites.  Vaginal discharge - Plan: Ambulatory referral to Gynecology  Amenorrhea - Plan: POCT urine pregnancy test is positive, Ambulatory referral to Gynecology  Positive urine pregnancy test - Plan: Ambulatory referral to Gynecology  Follow up as  scheduled.

## 2013-04-08 ENCOUNTER — Encounter: Payer: Medicaid Other | Attending: Endocrinology | Admitting: Nutrition

## 2013-04-08 ENCOUNTER — Encounter: Payer: Self-pay | Admitting: Endocrinology

## 2013-04-08 ENCOUNTER — Ambulatory Visit (INDEPENDENT_AMBULATORY_CARE_PROVIDER_SITE_OTHER): Payer: Medicaid Other | Admitting: Endocrinology

## 2013-04-08 ENCOUNTER — Other Ambulatory Visit: Payer: Self-pay

## 2013-04-08 VITALS — BP 114/78 | HR 98 | Temp 98.5°F | Ht 65.5 in | Wt 114.0 lb

## 2013-04-08 DIAGNOSIS — Z713 Dietary counseling and surveillance: Secondary | ICD-10-CM | POA: Insufficient documentation

## 2013-04-08 DIAGNOSIS — E109 Type 1 diabetes mellitus without complications: Secondary | ICD-10-CM | POA: Insufficient documentation

## 2013-04-08 DIAGNOSIS — O0001 Abdominal pregnancy with intrauterine pregnancy: Secondary | ICD-10-CM

## 2013-04-08 MED ORDER — INSULIN DETEMIR 100 UNIT/ML ~~LOC~~ SOLN: 30.0000 [IU] | Freq: Every day | SUBCUTANEOUS | Status: DC

## 2013-04-08 MED ORDER — GLUCOSE BLOOD VI STRP: ORAL_STRIP | Status: DC

## 2013-04-08 NOTE — Progress Notes (Signed)
   Subjective:    Patient ID: Taylor Warren, female    DOB: 03/10/1985, 28 y.o.   MRN: [id number]  HPI pt returns for f/u of type 1 DM (dx'ed 2006; she has mild if any neuropathy of the lower extremities; she is unaware of any associated chronic complications; she has been on insulin since dx; she takes multiple daily injections).  Pt walks in today, saying LMP was 2 months ago, and she is pregnant.  no cbg record, but states cbg's vary from 70-300, but most are in the high-100's.  It is highest in am.   Past Medical History  Diagnosis Date  . Diabetes mellitus without complication   . Anemia   . Urinary tract infection     pt still taking medication at this time    No past surgical history on file.  History   Social History  . Marital Status: Married    Spouse Name: N/A    Number of Children: N/A  . Years of Education: N/A   Occupational History  . Not on file.   Social History Main Topics  . Smoking status: Never Smoker   . Smokeless tobacco: Never Used  . Alcohol Use: No  . Drug Use: No  . Sexual Activity: Yes   Other Topics Concern  . Not on file   Social History Narrative  . No narrative on file    Current Outpatient Prescriptions on File Prior to Visit  Medication Sig Dispense Refill  . ferrous sulfate 325 (65 FE) MG tablet Take 325 mg by mouth daily with breakfast.      . glucose blood (ACCU-CHEK AVIVA) test strip 1 each by Other route 6 (six) times daily. And lancets 4/day 25.01  120 each  12  . insulin aspart (NOVOLOG) 100 UNIT/ML SOPN FlexPen Inject 8 Units into the skin 3 (three) times daily with meals. And pen needles 4/day       No current facility-administered medications on file prior to visit.   No Known Allergies  No family history on file.  BP 114/78  Pulse 98  Temp(Src) 98.5 F (36.9 C) (Oral)  Ht 5' 5.5" (1.664 m)  Wt 114 lb (51.71 kg)  BMI 18.68 kg/m2  SpO2 97%  LMP 02/26/2013  Review of Systems denies hypoglycemia and weight  change.      Objective:   Physical Exam VITAL SIGNS:  See vs page GENERAL: no distress  Lab Results  Component Value Date   HGBA1C 9.1* 01/22/2013      Assessment & Plan:  DM: poor control. Pregnancy, new.  In this context, she needs prompt improvement in her glycemic control.  She also needs to change lantus to levemir.   Noncompliance with cbg recording: this complicates the rx of DM.

## 2013-04-08 NOTE — Patient Instructions (Addendum)
Please do fasting blood tests here tomorrow morning. check your blood sugar 4 times a day: before the 3 meals, and at bedtime.  also check if you have symptoms of your blood sugar being too high or too low.  please keep a record of the readings and bring it to your next appointment here.  please call us sooner if your blood sugar goes below 70, or if you have a lot of readings over 200. Please change the lantus to levemir, 30 units at bedtime. Please continue the novolog, 8 units 3 times a day (just before each meal).  However, if you are going to be active, take just 5 units.  Also, take 2 extra units any time your blood sugar is over 250.  This insulin is taken just before you eat, and only when you eat.   Please come back for a follow-up appointment in 3 days.

## 2013-04-09 ENCOUNTER — Other Ambulatory Visit: Payer: Self-pay | Admitting: Endocrinology

## 2013-04-09 DIAGNOSIS — E109 Type 1 diabetes mellitus without complications: Secondary | ICD-10-CM

## 2013-04-09 NOTE — Progress Notes (Signed)
Per Dr. George HughEllison's order, we are going to start her on an insulin pump. She wants the [employer] insulin pump.  Paperwork was filled out and faxed in.  She will need a C-Peptide blood work, and will come in tomorrow morning for this.   We discussed the need to get her blood sugars down and the goals for blood sugar readings were given to her:  FBSs: less than 90, and 2hr. pc readings less than 120.   We reviewed her insulin doses per Dr. George HughEllison's orders.  She reported good understanding of how much she needs to take.   Becky from [employer] was called to stress urgency of this pump.  She agreed to hurry the paperwork along, and said that she has a loaner pump we can use if needed.  I told her, that this patient has another appt. with Dr. Everardo AllEllison on Thursday, and he will decide then if we can wait another week.

## 2013-04-09 NOTE — Patient Instructions (Signed)
Test blood sugars before meals, 2hr. After the meal, and at bedtime.

## 2013-04-10 LAB — CYCLIC CITRUL PEPTIDE ANTIBODY, IGG

## 2013-04-11 ENCOUNTER — Ambulatory Visit (INDEPENDENT_AMBULATORY_CARE_PROVIDER_SITE_OTHER): Payer: Medicaid Other | Admitting: Endocrinology

## 2013-04-11 ENCOUNTER — Encounter: Payer: Self-pay | Admitting: Endocrinology

## 2013-04-11 VITALS — BP 118/78 | HR 84 | Temp 98.0°F | Ht 65.5 in | Wt 118.0 lb

## 2013-04-11 DIAGNOSIS — E109 Type 1 diabetes mellitus without complications: Secondary | ICD-10-CM

## 2013-04-11 NOTE — Patient Instructions (Addendum)
check your blood sugar 4 times a day: before the 3 meals, and at bedtime.  also check if you have symptoms of your blood sugar being too high or too low.  please keep a record of the readings and bring it to your next appointment here.  please call us sooner if your blood sugar goes below 70, or if you have a lot of readings over 200. Please change the lantus to levemir, 30 units at bedtime. Please increase the novolog to 3 times a day (just before each meal) 8-12-8 units.  Also, take 2 extra units any time your blood sugar is over 250.  This insulin is taken just before you eat, and only when you eat.   Please come back for a follow-up appointment in 4-5 days.  Please redo the fasting blood tests.  Here is a map.

## 2013-04-11 NOTE — Progress Notes (Signed)
   Subjective:    Patient ID: [name] Daisey, female    DOB: 07/26/1985, 28 y.o.   MRN: [id number]  HPI pt returns for f/u of type 1 DM (dx'ed 2006; she has mild if any neuropathy of the lower extremities; she is unaware of any associated chronic complications; she has been on insulin since dx; she takes multiple daily injections).  Pt is approx [redacted] weeks pregnant, according to her dates.  no cbg record, but states cbg's vary from 60-250.  It is lowest in am, and highest in the afternoon.   Past Medical History  Diagnosis Date  . Diabetes mellitus without complication   . Anemia   . Urinary tract infection     pt still taking medication at this time    No past surgical history on file.  History   Social History  . Marital Status: Married    Spouse Name: N/A    Number of Children: N/A  . Years of Education: N/A   Occupational History  . Not on file.   Social History Main Topics  . Smoking status: Never Smoker   . Smokeless tobacco: Never Used  . Alcohol Use: No  . Drug Use: No  . Sexual Activity: Yes   Other Topics Concern  . Not on file   Social History Narrative  . No narrative on file    Current Outpatient Prescriptions on File Prior to Visit  Medication Sig Dispense Refill  . ferrous sulfate 325 (65 FE) MG tablet Take 325 mg by mouth daily with breakfast.      . glucose blood (ACCU-CHEK AVIVA) test strip 1 each by Other route 6 (six) times daily. And lancets 4/day 25.01  120 each  12  . glucose blood (ACCU-CHEK SMARTVIEW) test strip Use to test 8 times a day.  250 each  3  . insulin aspart (NOVOLOG) 100 UNIT/ML SOPN FlexPen Inject 8 Units into the skin 3 (three) times daily with meals. And pen needles 4/day      . insulin detemir (LEVEMIR) 100 UNIT/ML injection Inject 0.3 mLs (30 Units total) into the skin at bedtime. And syringes 1/day  10 mL  11   No current facility-administered medications on file prior to visit.    No Known Allergies  No family history on  file.  BP 118/78  Pulse 84  Temp(Src) 98 F (36.7 C) (Oral)  Ht 5' 5.5" (1.664 m)  Wt 118 lb (53.524 kg)  BMI 19.33 kg/m2  SpO2 97%  LMP 02/26/2013   Review of Systems Denies LOC and n/v    Objective:   Physical Exam VITAL SIGNS:  See vs page GENERAL: no distress NECK: There is no palpable thyroid enlargement.  No thyroid nodule is palpable.  No palpable lymphadenopathy at the anterior neck.     Assessment & Plan:  DM: she apparently needs increased rx Noncompliance with cbg's: we discussed need to record cbg's.

## 2013-04-17 ENCOUNTER — Telehealth: Payer: Self-pay

## 2013-04-17 ENCOUNTER — Ambulatory Visit: Payer: Medicaid Other | Admitting: Endocrinology

## 2013-04-17 NOTE — Telephone Encounter (Signed)
Please ask lab why it says "needs to be drawn."

## 2013-04-17 NOTE — Telephone Encounter (Signed)
Please tell pt she should come in for fasting labs.

## 2013-04-17 NOTE — Telephone Encounter (Signed)
Called lab and they state that pt did not come to have labs drawn.

## 2013-04-17 NOTE — Telephone Encounter (Signed)
Insulin pump supplying company call requesting C-pep results. [facility] and labs were not one on 04/08/2013.  Please advise, Thanks!

## 2013-04-18 NOTE — Telephone Encounter (Signed)
Pt informed

## 2013-04-19 ENCOUNTER — Ambulatory Visit (INDEPENDENT_AMBULATORY_CARE_PROVIDER_SITE_OTHER): Payer: Medicaid Other | Admitting: Endocrinology

## 2013-04-19 ENCOUNTER — Encounter: Payer: Self-pay | Admitting: Endocrinology

## 2013-04-19 VITALS — BP 116/60 | HR 98 | Temp 98.3°F | Ht 65.5 in | Wt 117.0 lb

## 2013-04-19 DIAGNOSIS — E109 Type 1 diabetes mellitus without complications: Secondary | ICD-10-CM

## 2013-04-19 NOTE — Patient Instructions (Addendum)
check your blood sugar 4 times a day: before the 3 meals, and at bedtime.  also check if you have symptoms of your blood sugar being too high or too low.  please keep a record of the readings and bring it to your next appointment here.  please call us sooner if your blood sugar goes below 70, or if you have a lot of readings over 200.  Please continue levemir, 30 units at bedtime.  Please increase the novolog to 3 times a day (just before each meal) 7-18-8 units.  Also, take 2 extra units any time your blood sugar is over 250.  This insulin is taken just before you eat, and only when you eat.   Please come back for a follow-up appointment in 1-2 weeks.

## 2013-04-19 NOTE — Progress Notes (Signed)
   Subjective:    Patient ID: Taylor Warren, female    DOB: 08/24/1985, 28 y.o.   MRN: [id number]  HPI pt returns for f/u of type 1 DM (dx'ed 2006; she has mild if any neuropathy of the lower extremities; she is unaware of any associated chronic complications; she has been on insulin since dx; she takes multiple daily injections).  Pt is approx [redacted] weeks pregnant, according to her dates.  no cbg record, but states cbg's vary from 50's (before lunch) to 300 (afternoon).  At hs and in am, it is approx 90.  pt states she feels well in general.   Past Medical History  Diagnosis Date  . Diabetes mellitus without complication   . Anemia   . Urinary tract infection     pt still taking medication at this time    No past surgical history on file.  History   Social History  . Marital Status: Married    Spouse Name: N/A    Number of Children: N/A  . Years of Education: N/A   Occupational History  . Not on file.   Social History Main Topics  . Smoking status: Never Smoker   . Smokeless tobacco: Never Used  . Alcohol Use: No  . Drug Use: No  . Sexual Activity: Yes   Other Topics Concern  . Not on file   Social History Narrative  . No narrative on file    Current Outpatient Prescriptions on File Prior to Visit  Medication Sig Dispense Refill  . ferrous sulfate 325 (65 FE) MG tablet Take 325 mg by mouth daily with breakfast.      . glucose blood (ACCU-CHEK AVIVA) test strip 1 each by Other route 6 (six) times daily. And lancets 4/day 25.01  120 each  12  . glucose blood (ACCU-CHEK SMARTVIEW) test strip Use to test 8 times a day.  250 each  3  . insulin aspart (NOVOLOG) 100 UNIT/ML SOPN FlexPen 3 times a day (just before each meal) 09-14-16 units, and pen needles 4/day      . insulin detemir (LEVEMIR) 100 UNIT/ML injection Inject 0.3 mLs (30 Units total) into the skin at bedtime. And syringes 1/day  10 mL  11   No current facility-administered medications on file prior to visit.     No Known Allergies  No family history on file.  BP 116/60  Pulse 98  Temp(Src) 98.3 F (36.8 C) (Oral)  Ht 5' 5.5" (1.664 m)  Wt 117 lb (53.071 kg)  BMI 19.17 kg/m2  SpO2 98%  LMP 02/26/2013  Review of Systems Denies LOC and n/v.      Objective:   Physical Exam VITAL SIGNS:  See vs page GENERAL: no distress PSYCH: Alert and well-oriented.  Does not appear anxious nor depressed.     Assessment & Plan:  DM: she apparently needs increased rx Noncompliance with cbg's: we discussed need to record cbg's.

## 2013-04-23 ENCOUNTER — Other Ambulatory Visit: Payer: Self-pay | Admitting: Obstetrics and Gynecology

## 2013-04-23 DIAGNOSIS — R58 Hemorrhage, not elsewhere classified: Secondary | ICD-10-CM

## 2013-04-26 ENCOUNTER — Encounter: Payer: Self-pay | Admitting: Endocrinology

## 2013-04-26 ENCOUNTER — Ambulatory Visit (INDEPENDENT_AMBULATORY_CARE_PROVIDER_SITE_OTHER): Payer: Medicaid Other | Admitting: Endocrinology

## 2013-04-26 VITALS — BP 118/72 | HR 91 | Temp 98.4°F | Ht 65.5 in | Wt 118.0 lb

## 2013-04-26 DIAGNOSIS — E109 Type 1 diabetes mellitus without complications: Secondary | ICD-10-CM

## 2013-04-26 LAB — HEMOGLOBIN A1C: Hgb A1c MFr Bld: 9.1 % — ABNORMAL HIGH (ref 4.6–6.5)

## 2013-04-26 LAB — BASIC METABOLIC PANEL
BUN: 11 mg/dL (ref 6–23)
CHLORIDE: 102 meq/L (ref 96–112)
CO2: 27 mEq/L (ref 19–32)
Calcium: 9.7 mg/dL (ref 8.4–10.5)
Creatinine, Ser: 0.6 mg/dL (ref 0.4–1.2)
GFR: 141.38 mL/min (ref >60.00)
Glucose, Bld: 96 mg/dL (ref 70–99)
POTASSIUM: 4.5 meq/L (ref 3.5–5.1)
SODIUM: 135 meq/L (ref 135–145)

## 2013-04-26 LAB — TSH: TSH: 2.07 u[IU]/mL (ref 0.35–5.50)

## 2013-04-26 NOTE — Patient Instructions (Addendum)
check your blood sugar 6 times a day: before and after the 3 meals.  also check if you have symptoms of your blood sugar being too high or too low.  please keep a record of the readings and bring it to your next appointment here.  please call us sooner if your blood sugar goes below 70, or if you have a lot of readings over 200.  Please reduce levemir to 26 units at bedtime. This will remove the need to eat at bedtime.   Please continue the novolog, 3 times a day (just before each meal) 7-18-8 units.  Also, take 2 extra units any time your blood sugar is over 250.  This insulin is taken just before you eat, and only when you eat.   Please come back for a follow-up appointment in 1-2 weeks.   blood tests are being requested for you today.  We'll contact you with results.

## 2013-04-26 NOTE — Progress Notes (Signed)
   Subjective:    Patient ID: Taylor Warren, female    DOB: 11/08/1985, 28 y.o.   MRN: [id number]  HPI pt returns for f/u of type 1 DM (dx'ed 2006; she has mild if any neuropathy of the lower extremities; she is unaware of any associated chronic complications; she has been on insulin since dx; she has only had 1 episode severe hypoglycemia (mid-2014); she has never had DKA; she takes multiple daily injections).  Pt is approx [redacted] weeks pregnant, according to her dates.  She was advised to pursue an insulin pump, but she has not yet done fasting labs; she brings a record of her cbg's which i have reviewed today.  It is scanned into the record.  She says she has to eat a snack at bedtime in order to avoid am hypoglycemia.  Nausea is better now.   Past Medical History  Diagnosis Date  . Diabetes mellitus without complication   . Anemia   . Urinary tract infection     pt still taking medication at this time    No past surgical history on file.  History   Social History  . Marital Status: Married    Spouse Name: N/A    Number of Children: N/A  . Years of Education: N/A   Occupational History  . Not on file.   Social History Main Topics  . Smoking status: Never Smoker   . Smokeless tobacco: Never Used  . Alcohol Use: No  . Drug Use: No  . Sexual Activity: Yes   Other Topics Concern  . Not on file   Social History Narrative  . No narrative on file    Current Outpatient Prescriptions on File Prior to Visit  Medication Sig Dispense Refill  . ferrous sulfate 325 (65 FE) MG tablet Take 325 mg by mouth daily with breakfast.      . glucose blood (ACCU-CHEK AVIVA) test strip 1 each by Other route 6 (six) times daily. And lancets 4/day 25.01  120 each  12  . glucose blood (ACCU-CHEK SMARTVIEW) test strip Use to test 8 times a day.  250 each  3  . insulin aspart (NOVOLOG) 100 UNIT/ML SOPN FlexPen 3 times a day (just before each meal) 09-14-16 units, and pen needles 4/day       No  current facility-administered medications on file prior to visit.    No Known Allergies  No family history on file.  BP 118/72  Pulse 91  Temp(Src) 98.4 F (36.9 C) (Oral)  Ht 5' 5.5" (1.664 m)  Wt 118 lb (53.524 kg)  BMI 19.33 kg/m2  SpO2 97%  LMP 02/26/2013   Review of Systems Denies vomiting and LOC    Objective:   Physical Exam VITAL SIGNS:  See vs page GENERAL: no distress PSYCH: Alert and well-oriented.  Does not appear anxious nor depressed.  Lab Results  Component Value Date   HGBA1C 9.1* 04/26/2013       Assessment & Plan:  Type 1 DM: she is eating defensively at hs. Pregnancy: nausea is improved.

## 2013-04-27 LAB — C-PEPTIDE

## 2013-04-29 NOTE — Telephone Encounter (Signed)
Patient was informed ion 03/14/2013

## 2013-04-30 ENCOUNTER — Encounter (HOSPITAL_COMMUNITY): Payer: Self-pay

## 2013-04-30 ENCOUNTER — Ambulatory Visit (HOSPITAL_COMMUNITY)
Admission: RE | Admit: 2013-04-30 | Discharge: 2013-04-30 | Disposition: A | Discharge status: Home / Self Care | Payer: Medicaid Other | Source: Ambulatory Visit | Attending: Obstetrics and Gynecology | Admitting: Obstetrics and Gynecology

## 2013-04-30 DIAGNOSIS — O209 Hemorrhage in early pregnancy, unspecified: Secondary | ICD-10-CM | POA: Insufficient documentation

## 2013-04-30 DIAGNOSIS — R58 Hemorrhage, not elsewhere classified: Secondary | ICD-10-CM

## 2013-04-30 LAB — ANTI-ISLET CELL ANTIBODY: Pancreatic Islet Cell Antibody: 10 JDF Units — AB (ref <5)

## 2013-05-06 ENCOUNTER — Telehealth: Payer: Self-pay | Admitting: *Deleted

## 2013-05-06 NOTE — Telephone Encounter (Signed)
Novolog pen is not covered preferred insulin is Humalog pen. Ok to change? Thanks!

## 2013-05-06 NOTE — Telephone Encounter (Signed)
ok 

## 2013-05-06 NOTE — Telephone Encounter (Signed)
Pharmacy notified.

## 2013-05-07 ENCOUNTER — Encounter: Payer: Self-pay | Admitting: Obstetrics and Gynecology

## 2013-05-07 ENCOUNTER — Ambulatory Visit (INDEPENDENT_AMBULATORY_CARE_PROVIDER_SITE_OTHER): Payer: Medicaid Other | Admitting: Endocrinology

## 2013-05-07 ENCOUNTER — Telehealth: Payer: Self-pay | Admitting: *Deleted

## 2013-05-07 ENCOUNTER — Other Ambulatory Visit: Payer: Self-pay

## 2013-05-07 ENCOUNTER — Encounter: Payer: Self-pay | Admitting: Endocrinology

## 2013-05-07 VITALS — BP 118/76 | HR 97 | Temp 98.2°F | Ht 65.5 in | Wt 123.0 lb

## 2013-05-07 DIAGNOSIS — E109 Type 1 diabetes mellitus without complications: Secondary | ICD-10-CM

## 2013-05-07 MED ORDER — INSULIN REGULAR HUMAN 100 UNIT/ML IJ SOLN: INTRAMUSCULAR | Status: DC

## 2013-05-07 MED ORDER — INSULIN DETEMIR 100 UNIT/ML ~~LOC~~ SOLN: 22.0000 [IU] | Freq: Every day | SUBCUTANEOUS | Status: DC

## 2013-05-07 MED ORDER — INSULIN LISPRO 100 UNIT/ML (KWIKPEN): PEN_INJECTOR | SUBCUTANEOUS | Status: DC

## 2013-05-07 MED ORDER — INSULIN DETEMIR 100 UNIT/ML FLEXPEN: PEN_INJECTOR | SUBCUTANEOUS | Status: DC

## 2013-05-07 MED ORDER — INSULIN NPH (HUMAN) (ISOPHANE) 100 UNIT/ML ~~LOC~~ SUSP: 20.0000 [IU] | Freq: Every day | SUBCUTANEOUS | Status: DC

## 2013-05-07 NOTE — Telephone Encounter (Signed)
Pt came into facility stating that neither Humalog or Levemir is covered. Spoke with pharmacy pharmacist states that pt has family planning medicaid and that insulin is not covered. Please advise, Thanks!

## 2013-05-07 NOTE — Patient Instructions (Addendum)
check your blood sugar 6 times a day: before and after the 3 meals.  also check if you have symptoms of your blood sugar being too high or too low.  please keep a record of the readings and bring it to your next appointment here.  please call us sooner if your blood sugar goes below 70, or if you have a lot of readings over 200.  Please reduce levemir to 22 units at bedtime. This will remove the need to eat at bedtime.   Please continue the novolog, 3 times a day (just before each meal) 7-18-8 units.  Also, take 2 extra units any time your blood sugar is over 250.  This insulin is taken just before you eat, and only when you eat.   Please come back for a follow-up appointment in 1-2 weeks.   blood tests are being requested for you today.  We'll contact you with results.

## 2013-05-07 NOTE — Telephone Encounter (Signed)
Pharmacy needs quantity -how many units per time

## 2013-05-07 NOTE — Telephone Encounter (Signed)
Please change the novolog to humulin R at the same dosage. Please change the levemir to N, 20 units qhs i have sent prescriptions to your pharmacy, for both. i'll see you next time.

## 2013-05-07 NOTE — Telephone Encounter (Signed)
Pt's sister advised

## 2013-05-07 NOTE — Progress Notes (Signed)
   Subjective:    Patient ID: Taylor Warren, female    DOB: 01/15/1986, 28 y.o.   MRN: [id number]  HPI pt returns for f/u of type 1 DM (dx'ed 2006; she has mild if any neuropathy of the lower extremities; she is unaware of any associated chronic complications; she has been on insulin since dx; she has only had 1 episode severe hypoglycemia (mid-2014); she has never had DKA; she takes multiple daily injections).  Pt is approx [redacted] weeks pregnant, according to her dates.  We are still awaiting approval of pump therapy.  she brings a record of her cbg's which i have reviewed today.  It is scanned into the record.  It still varies widely.  Since last ov, she says she has had only 1 episode of hypoglycemia (37).  It was at 5 AM.   She says she still has to eat at hs, in order to prevent early am hypoglycemia.  I am advised today that medicaid is not covering DM meds, supplies, or services.   Past Medical History  Diagnosis Date  . Diabetes mellitus without complication   . Anemia   . Urinary tract infection     pt still taking medication at this time    No past surgical history on file.  History   Social History  . Marital Status: Married    Spouse Name: N/A    Number of Children: N/A  . Years of Education: N/A   Occupational History  . Not on file.   Social History Main Topics  . Smoking status: Never Smoker   . Smokeless tobacco: Never Used  . Alcohol Use: No  . Drug Use: No  . Sexual Activity: Yes   Other Topics Concern  . Not on file   Social History Narrative  . No narrative on file    Current Outpatient Prescriptions on File Prior to Visit  Medication Sig Dispense Refill  . ferrous sulfate 325 (65 FE) MG tablet Take 325 mg by mouth daily with breakfast.      . glucose blood (ACCU-CHEK AVIVA) test strip 1 each by Other route 6 (six) times daily. And lancets 4/day 25.01  120 each  12  . glucose blood (ACCU-CHEK SMARTVIEW) test strip Use to test 8 times a day.  250 each  3    No current facility-administered medications on file prior to visit.   No Known Allergies  No family history on file.  BP 118/76  Pulse 97  Temp(Src) 98.2 F (36.8 C) (Oral)  Ht 5' 5.5" (1.664 m)  Wt 123 lb (55.792 kg)  BMI 20.15 kg/m2  SpO2 96%  LMP 02/26/2013  Review of Systems Denies LOC and weight change.     Objective:   Physical Exam VITAL SIGNS:  See vs page GENERAL: no distress PSYCH: Alert and well-oriented.  Does not appear anxious nor depressed.   Lab Results  Component Value Date   HGBA1C 9.1* 04/26/2013      Assessment & Plan:  Type 1 DM: therapy is limited by the fact that she is still eating defensively at hs. Economic circumstances: these limit the rx of DM: she will not be able t obtain pump or insulin analogs.

## 2013-05-08 ENCOUNTER — Telehealth: Payer: Self-pay

## 2013-05-08 NOTE — Telephone Encounter (Signed)
Called pharmacy and Pharmacist stated that Humulin N was 136 and R was 136. Wanted to know if brand could be changed to Relion. Pt's sister has called very upset about the price of the medication.  Please advise,  Thanks!

## 2013-05-08 NOTE — Telephone Encounter (Signed)
Yes, relion is fine

## 2013-05-08 NOTE — Telephone Encounter (Signed)
Pharmacy notified of change and left voicemail stating the the relion brand could be pickup at [facility] and it would be 25$ each. Pt advised that prescription was at pharmacy and could pick up.

## 2013-05-09 ENCOUNTER — Ambulatory Visit: Payer: Medicaid Other | Admitting: *Deleted

## 2013-05-14 ENCOUNTER — Telehealth: Payer: Self-pay | Admitting: Endocrinology

## 2013-05-14 NOTE — Telephone Encounter (Signed)
Please reduce the levemir to 20 units qhs.  This should eliminate the need to eat in the middle of the night. Please increase Novolog to 3 times a day (just before each meal) 02-19-11 units. i'll see you next time.

## 2013-05-14 NOTE — Telephone Encounter (Signed)
I talked with Taylor Warren.  She reports that FBSs are ususally between 120-130s.  Her FBS today was 152.  Today before lunch it was 225. Yesterday:  FBS: 122, acL: 250, acS: 180,  HS: 185,  3AM: 70.  Pt. Reports that she eats something every night at 3AM.  She denies low blood sugars symptoms, and admits that every day her blood sugars before lunch are in the 200s.  She is taking Levemir: 22u at HS, Novolog: 8u acB, 18u acL,  8u acS.

## 2013-05-15 NOTE — Telephone Encounter (Signed)
No phone call was made.  This was an error

## 2013-05-15 NOTE — Telephone Encounter (Signed)
Pt's mother informed of new dosage.

## 2013-05-21 ENCOUNTER — Ambulatory Visit (INDEPENDENT_AMBULATORY_CARE_PROVIDER_SITE_OTHER): Payer: Medicaid Other | Admitting: Endocrinology

## 2013-05-21 ENCOUNTER — Encounter: Payer: Medicaid Other | Attending: Endocrinology | Admitting: Nutrition

## 2013-05-21 ENCOUNTER — Encounter: Payer: Self-pay | Admitting: Endocrinology

## 2013-05-21 VITALS — BP 110/60 | HR 96 | Temp 98.0°F | Ht 65.5 in | Wt 123.0 lb

## 2013-05-21 DIAGNOSIS — Z713 Dietary counseling and surveillance: Secondary | ICD-10-CM | POA: Insufficient documentation

## 2013-05-21 DIAGNOSIS — E109 Type 1 diabetes mellitus without complications: Secondary | ICD-10-CM

## 2013-05-21 NOTE — Patient Instructions (Addendum)
check your blood sugar 6 times a day: before and after the 3 meals.  also check if you have symptoms of your blood sugar being too high or too low.  please keep a record of the readings and bring it to your next appointment here.  please call us sooner if your blood sugar goes below 70, or if you have a lot of readings over 200.  Please reduce NPH to 18 units at bedtime. This will remove the need to eat at bedtime.   Please continue the novolog, 3 times a day (just before each meal) 02-23-15 units.  Also, take 2 extra units any time your blood sugar is over 250.  This insulin is taken just before you eat, and only when you eat.   Please come back for a follow-up appointment in 3 weeks.   Please call or message us next week, to report on your blood sugar.

## 2013-05-21 NOTE — Progress Notes (Signed)
   Subjective:    Patient ID: Taylor Warren, female    DOB: 01/18/1986, 28 y.o.   MRN: [id number]  HPI pt returns for f/u of type 1 DM (dx'ed 2006; she has mild if any neuropathy of the lower extremities; she is unaware of any associated chronic complications; she has been on insulin since dx; she has only had 1 episode severe hypoglycemia (mid-2014); she has never had DKA; she takes multiple daily injections).  Pt is approx [redacted] weeks pregnant, according to her dates.  We have received approval for pump therapy. Pt says despite the reduction of the NPH, she has to eat or drink at hs in order to prevent hypoglycemia in the middle of the night.  she brings a record of her cbg's which i have reviewed today.  It is scanned into the record.  She has gotten medicaid. Past Medical History  Diagnosis Date  . Diabetes mellitus without complication   . Anemia   . Urinary tract infection     pt still taking medication at this time    No past surgical history on file.  History   Social History  . Marital Status: Married    Spouse Name: N/A    Number of Children: N/A  . Years of Education: N/A   Occupational History  . Not on file.   Social History Main Topics  . Smoking status: Never Smoker   . Smokeless tobacco: Never Used  . Alcohol Use: No  . Drug Use: No  . Sexual Activity: Yes   Other Topics Concern  . Not on file   Social History Narrative  . No narrative on file    Current Outpatient Prescriptions on File Prior to Visit  Medication Sig Dispense Refill  . ferrous sulfate 325 (65 FE) MG tablet Take 325 mg by mouth daily with breakfast.      . glucose blood (ACCU-CHEK AVIVA) test strip 1 each by Other route 6 (six) times daily. And lancets 4/day 25.01  120 each  12  . glucose blood (ACCU-CHEK SMARTVIEW) test strip Use to test 8 times a day.  250 each  3   No current facility-administered medications on file prior to visit.    No Known Allergies  No family history on  file.  BP 110/60  Pulse 96  Temp(Src) 98 F (36.7 C) (Oral)  Ht 5' 5.5" (1.664 m)  Wt 123 lb (55.792 kg)  BMI 20.15 kg/m2  SpO2 99%  LMP 02/26/2013   Review of Systems She has gained 9 lbs, in the pregnancy so far.  Denies n/v    Objective:   Physical Exam VITAL SIGNS:  See vs page GENERAL: no distress     Assessment & Plan:  Type 1 DM: therapy is limited by the fact that she is still eating defensively at hs.  During the day, Based on the pattern of her cbg's, she needs some adjustment in her therapy. Economic circumstances: these limit the rx of DM: however, these are better now, as she has gotten medicaid.     Pt will start a basal rate of 0.5 units/hr, except for units/hr, 24 hrs per day, and: A mealtime bolus of 1 unit/ 15 grams carbohydrate, and: A correction bolus (which some people call "sensitivity," or "insulin sensitivity ratio," or just "isr") of 1 unit for each 40 by which your glucose exceeds 100.

## 2013-05-22 NOTE — Patient Instructions (Signed)
Do the first 3 chapters in the work book before coming on Monday.   Watch video before coming on Monday

## 2013-05-22 NOTE — Progress Notes (Signed)
Taylor LombardFatima was here with her interpreter and husband.  She reports that she has not gotten her pump from [employer].  I phoned the company and they said that it was shipped out on Friday.   She was told to open the box, watch the video, and do the first 3 chapters of the work book.  She agreed to do this.  She is not counting grams of carbohydrates, but is using serving sizes, which are 15 grams.   She is not able to understand English well, and [facility] to attend the pump start on Monday.   She had no questions at this time.  She will call if insulin pump does not come by Thursday.

## 2013-05-27 ENCOUNTER — Encounter: Payer: Medicaid Other | Admitting: Nutrition

## 2013-05-27 DIAGNOSIS — E109 Type 1 diabetes mellitus without complications: Secondary | ICD-10-CM

## 2013-05-28 ENCOUNTER — Other Ambulatory Visit: Payer: Self-pay | Admitting: Endocrinology

## 2013-05-28 ENCOUNTER — Encounter: Payer: Medicaid Other | Admitting: Nutrition

## 2013-05-28 ENCOUNTER — Telehealth: Payer: Self-pay | Admitting: Endocrinology

## 2013-05-28 DIAGNOSIS — E109 Type 1 diabetes mellitus without complications: Secondary | ICD-10-CM

## 2013-05-28 NOTE — Progress Notes (Signed)
This patient is here with her interpreter, and was started on a [employer] insulin pump.  She had not done any of the prepump review.   She entered date/time/and calculations with little assistance from me.   Basal rates and carb calculations were entered per Dr.Ellison's order:  Basal rate: MN:  0.5.  I/C ratio: 15,  ISF: 40,  Target: 90-100, and timing: 4 hours.   We reviewed how to give a bolus and carb exchanges/servings. She redemonstrated how to give a bolus correctly X2.   She took her Levemir last night at I explained that her basal rates are very low, but that I will call her tonight at 10 PM to see how she has done today.  She was encouraged to call the office if blood sugars stay high today, or drop low.  She agreed to do this.   She filled a cartridge and attached a Mio 6mm infusion set without any difficulty.  She was given written directions for filling and putting in the cartridge and infusion sets.   She was given a log sheet to record her blood sugars and told to test her blood sugars before, 2hr. pc and HS, and 4AM for the next 3 days. We set up her meter to send the readings to the pump.  She was shown how to charge the meter, and she reported good understanding of this.  She had no final questions. She was given a DVD on how to use the pump and told to watch it tonight, and we will review things tomorrow.  She agreed to do this. I told her that I would call her tonight 2hr. pc supper, but her supper meal is at 10PM.  So I told her I would call her at 10PM.

## 2013-05-28 NOTE — Patient Instructions (Signed)
watch video Test blood sugars before meals, 2hr. After meals, and at [facility]

## 2013-05-28 NOTE — Telephone Encounter (Signed)
Pt. Reported that she had made a mistake on putting in her bolus carbs.  She said that she put servings, into the pump instead of total number of carbs.  Her blood sugar 2hr pc L was 300.  She gave a correction bolus, and her blood sugar was now (10PM) 148 before supper.  We reviewed again how to put in the number of carbs eaten, and she reported good understanding.  She said that her husband helped her to see what she had done wrong, and will watch her tonight before eating supper.  She had no questions for me at this time.  She was reminded of her appt. With me tomorrow morning.

## 2013-05-29 ENCOUNTER — Encounter: Payer: Medicaid Other | Attending: Endocrinology | Admitting: Nutrition

## 2013-05-29 ENCOUNTER — Ambulatory Visit (INDEPENDENT_AMBULATORY_CARE_PROVIDER_SITE_OTHER): Payer: Medicaid Other | Admitting: Endocrinology

## 2013-05-29 ENCOUNTER — Encounter: Payer: Self-pay | Admitting: Endocrinology

## 2013-05-29 VITALS — BP 110/60 | HR 90 | Temp 98.3°F | Ht 67.0 in | Wt 127.0 lb

## 2013-05-29 DIAGNOSIS — E109 Type 1 diabetes mellitus without complications: Secondary | ICD-10-CM

## 2013-05-29 DIAGNOSIS — Z713 Dietary counseling and surveillance: Secondary | ICD-10-CM | POA: Insufficient documentation

## 2013-05-29 NOTE — Patient Instructions (Signed)
Do a food record for me today, tonight and in the morning, and bring in tomorrow Continue to test blood sugars before meals, 2hr. After meals, and at 3 AM.

## 2013-05-29 NOTE — Patient Instructions (Signed)
Review the video again before doing a cartridge and infusion set change Call Monday with blood sugar readings.

## 2013-05-29 NOTE — Progress Notes (Signed)
Subjective:    Patient ID: Taylor Warren, female    DOB: 05/05/1985, 28 y.o.   MRN: [id number]  HPI pt returns for f/u of type 1 DM (dx'ed 2006; she has mild if any neuropathy of the lower extremities; she is unaware of any associated chronic complications; she has been on insulin since dx; she has only had 1 episode severe hypoglycemia (mid-2014); she has never had DKA; she takes multiple daily injections).  Pt is approx [redacted] weeks pregnant, according to her dates.  We have received approval for pump therapy 2 days ago. She averages a total of approx 30 units per day, via her pump.  she brings a record of her cbg's which i have reviewed today, which is scanned into the record.  cbg is in the 100's in am, due to bolus in the middle of the night. She takes a basal rate of 0.5 units/hr, except for units/hr, 24 hrs per day, and:  A mealtime bolus of 1 unit/ 15 grams carbohydrate, and:  A correction bolus (which some people call "sensitivity," or "insulin sensitivity ratio," or just "isr") of 1 unit for each 40 by which your glucose exceeds 100. Past Medical History  Diagnosis Date  . Diabetes mellitus without complication   . Anemia   . Urinary tract infection     pt still taking medication at this time    No past surgical history on file.  History   Social History  . Marital Status: Married    Spouse Name: N/A    Number of Children: N/A  . Years of Education: N/A   Occupational History  . Not on file.   Social History Main Topics  . Smoking status: Never Smoker   . Smokeless tobacco: Never Used  . Alcohol Use: No  . Drug Use: No  . Sexual Activity: Yes   Other Topics Concern  . Not on file   Social History Narrative  . No narrative on file    Current Outpatient Prescriptions on File Prior to Visit  Medication Sig Dispense Refill  . ferrous sulfate 325 (65 FE) MG tablet Take 325 mg by mouth daily with breakfast.      . glucose blood (ACCU-CHEK AVIVA) test strip 1 each  by Other route 6 (six) times daily. And lancets 4/day 25.01  120 each  12  . glucose blood (ACCU-CHEK SMARTVIEW) test strip Use to test 8 times a day.  250 each  3  . insulin NPH Human (HUMULIN N,NOVOLIN N) 100 UNIT/ML injection Inject 18 Units into the skin at bedtime.      . insulin regular (NOVOLIN R,HUMULIN R) 100 units/mL injection Inject into the skin 3 times daily just before each meal. 02-23-15.  And syringes 3/day.       No current facility-administered medications on file prior to visit.    No Known Allergies  No family history on file.  BP 110/60  Pulse 90  Temp(Src) 98.3 F (36.8 C) (Oral)  Ht 5\' 7"  (1.702 m)  Wt 127 lb (57.607 kg)  BMI 19.89 kg/m2  SpO2 96%  LMP 02/26/2013  Review of Systems Denies LOC and n/v    Objective:   Physical Exam VITAL SIGNS:  See vs page GENERAL: no distress SKIN:  Insulin infusion sites at the anterior abdomen are normal.        Assessment & Plan:  DM: now on pump rx: she needs increased rx Pregnancy: pt is tolerating well. Economic circumstances: these have limited  the rx of DM: however, these are better now, as she has gotten medicaid.

## 2013-05-29 NOTE — Progress Notes (Signed)
Patient is here today with the interpreter.  She reported that she is better able to do boluses with each meal now.  She appears to be counting carbs correctly, and is using the bolus wizard correctly.  Blood sugars appear to be going high after meals--especially after supper, and she is doing correctly boluses all night long.   Dr. Everardo AllEllison has made changes to her basal rate, and bolus calculations,  and she is not able to make those changes on her own.  She was shown how to do this, and she reports good understanding of this. We reviewed another time how to make these changes.  She was encouraged to watch the video again to learn to do this.   She did a cartridge and infusion set change with little assistance from me.  She was told to review the video before she does the next one on Saturday.  She was reminded, that she will need to do this every 3 days.  She reported good understanding of this.   We reviewed the high blood sugar protocol and she was given a sheet on what to do for this.  She said that she understood what to do for this and had no questions.  She signed off on understanding all topics, and agreed to call me on Monday with blood sugar readings.  She had no final questions.

## 2013-05-29 NOTE — Progress Notes (Signed)
Patient reports that her blood sugar was high, but she did not know why.  She said that she is counting carbs in grams, and not using exchanges.  We reviewed this, and it appears she is pretty close to the amounts of carbs.  She is doing correction boluses correctly, and her FBS today was 150.   She reports no diffiuclty sleeping with the pump or wearing it.  She is using the Calorie Brooke DareKing book to help her with her carb counting.  She does eat out at fast food, like [employer].  She will eat a hamburger, and medium order of fries.  She was able to look up the amounts correctly for this.  She says she is drinking mostly water, but other drinks are non caloric.   She reports have reviewed the video with her husband, and he helped her with the translation for areas she did not understand.  We discussed again the difference between basal insulin and boluses and she reported good understanding of this.  We reviewed again how she gives a bolus, and she seems to be doing this correctly.  Because of a mistake she made in giving the bolus at lunch  meal yesterday, she reviewed with her husband how to do it at supper last night, and again at breakfast this morning.   She denies any low blood sugars We reviewed how/when to use the temporary basal rate, and she reported good understanding of this.  She will keep a food record for me today and I will review it tomorrow to see if her carb counting is correct.

## 2013-05-29 NOTE — Patient Instructions (Signed)
check your blood sugar 7 times a day: before and after the 3 meals.  also check if you have symptoms of your blood sugar being too high or too low.  please keep a record of the readings and bring it to your next appointment here.  please call us sooner if your blood sugar goes below 70, or if you have a lot of readings over 200.  Please increase your basal rate to 0.8 units/hr, except for units/hr, 24 hrs per day, and:  Increase your mealtime bolus to 1 unit/ 12 grams carbohydrate, and:  A correction bolus (which some people call "sensitivity," or "insulin sensitivity ratio," or just "isr") of 1 unit for each 40 by which your glucose exceeds 100.  Please call or message us next week, to report on your blood sugar.   Pleas call or message us in 4 days, to report progress.   Please come back for a follow-up appointment in 2 weeks.

## 2013-06-04 ENCOUNTER — Other Ambulatory Visit: Payer: Self-pay | Admitting: Endocrinology

## 2013-06-04 DIAGNOSIS — E109 Type 1 diabetes mellitus without complications: Secondary | ICD-10-CM

## 2013-06-05 ENCOUNTER — Encounter: Payer: Medicaid Other | Admitting: Obstetrics and Gynecology

## 2013-06-05 ENCOUNTER — Encounter: Payer: Medicaid Other | Admitting: Nutrition

## 2013-06-05 ENCOUNTER — Encounter: Payer: Self-pay | Admitting: *Deleted

## 2013-06-05 DIAGNOSIS — R8271 Bacteriuria: Principal | ICD-10-CM

## 2013-06-05 DIAGNOSIS — O9989 Other specified diseases and conditions complicating pregnancy, childbirth and the puerperium: Principal | ICD-10-CM

## 2013-06-05 DIAGNOSIS — E109 Type 1 diabetes mellitus without complications: Secondary | ICD-10-CM

## 2013-06-05 LAB — POCT URINALYSIS DIP (DEVICE)
Bilirubin Urine: NEGATIVE
Glucose, UA: NEGATIVE mg/dL
Hgb urine dipstick: NEGATIVE
KETONES UR: NEGATIVE mg/dL
Nitrite: NEGATIVE
PH: 7.5 (ref 5.0–8.0)
PROTEIN: NEGATIVE mg/dL
Specific Gravity, Urine: 1.015 (ref 1.005–1.030)
Urobilinogen, UA: 0.2 mg/dL (ref 0.0–1.0)

## 2013-06-05 NOTE — Progress Notes (Signed)
Pt arrived for New Ob appt today but then had to leave because her interpreter could not stay.  Pt had submitted specimen for urinalysis which had abnormal results. Specimen sent for culture.

## 2013-06-05 NOTE — Progress Notes (Signed)
Patient is approx. [redacted] weeks pregnant.  She is not certain, and says no one has told her how far along she is.    Patient is here today because she could not give me the blood sugar readings over the phone with dates/times.  She is here with her husband today that speaks English better than she does. She complains of a tooth infection for past 2 1/2 days.  She has seen a dentist and it is feeling better now.   Meter download shows variable blood sugar readings, especially after supper meal--at 10PM which is her largest meal.  Three low blood sugars before lunch, in the low 60s.  Per Dr. Remus BlakeKumar's orders, the basal rate was decreased by 0.1u/hr from 10AM to 12PM.   Some very high blood sugar readings the last 2 days, which she says was due to pain-infection in her tooth.  The 2hr. pc blood sugar readings appear to be better--averaging around 140, but climbing after her late supper meal.  Will consider changing the I/C ratio at least at supper next week, when she sees Dr. Everardo AllEllison.

## 2013-06-05 NOTE — Patient Instructions (Addendum)
Keep appt. With D.r Everardo AllEllison next week Continue to test blood sugars before meals, 2hr. After meals, and at bedtime and 3AM. Call if blood sugars continue to go below 60 and over 100 before meals

## 2013-06-12 ENCOUNTER — Ambulatory Visit (INDEPENDENT_AMBULATORY_CARE_PROVIDER_SITE_OTHER): Payer: Medicaid Other | Admitting: Endocrinology

## 2013-06-12 ENCOUNTER — Encounter: Payer: Self-pay | Admitting: Endocrinology

## 2013-06-12 VITALS — BP 112/74 | HR 100 | Temp 98.6°F | Ht 65.0 in | Wt 128.0 lb

## 2013-06-12 DIAGNOSIS — E109 Type 1 diabetes mellitus without complications: Secondary | ICD-10-CM

## 2013-06-12 MED ORDER — GLUCOSE BLOOD VI STRP: ORAL_STRIP | Status: DC

## 2013-06-12 MED ORDER — INSULIN ASPART 100 UNIT/ML ~~LOC~~ SOLN: SUBCUTANEOUS | Status: DC

## 2013-06-12 NOTE — Progress Notes (Signed)
   Subjective:    Patient ID: Taylor Warren, female    DOB: 02/04/1986, 28 y.o.   MRN: [id number]  HPI pt returns for f/u of type 1 DM (dx'ed 2006; she has mild if any neuropathy of the lower extremities; she is unaware of any associated chronic complications; she has been on insulin since dx; she has only had 1 episode severe hypoglycemia (mid-2014); she has never had DKA; she takes multiple daily injections).  Pt is approx [redacted] weeks pregnant, according to her dates.  She still averages a total of approx 30 units per day, via her pump.  She takes a basal rate of 0.8 units/hr, except 0.7/hr, 10 AM-12 noon, and:  A mealtime bolus of 1 unit/ 12 grams carbohydrate, and:  A correction bolus (which some people call "sensitivity," or "insulin sensitivity ratio," or just "isr") of 1 unit for each 40 by which your glucose exceeds 100. She says her cbg's are still affected by a dental infection.  She was rx'ed an antibiotic last week.  Past Medical History  Diagnosis Date  . Diabetes mellitus without complication   . Anemia   . Urinary tract infection     pt still taking medication at this time    No past surgical history on file.  History   Social History  . Marital Status: Married    Spouse Name: N/A    Number of Children: N/A  . Years of Education: N/A   Occupational History  . Not on file.   Social History Main Topics  . Smoking status: Never Smoker   . Smokeless tobacco: Never Used  . Alcohol Use: No  . Drug Use: No  . Sexual Activity: Yes   Other Topics Concern  . Not on file   Social History Narrative  . No narrative on file    Current Outpatient Prescriptions on File Prior to Visit  Medication Sig Dispense Refill  . ferrous sulfate 325 (65 FE) MG tablet Take 325 mg by mouth daily with breakfast.       No current facility-administered medications on file prior to visit.    No Known Allergies  No family history on file.  BP 112/74  Pulse 100  Temp(Src) 98.6 F  (37 C) (Oral)  Ht 5\' 5"  (1.651 m)  Wt 128 lb (58.06 kg)  BMI 21.30 kg/m2  SpO2 96%  LMP 02/26/2013  Review of Systems She denies hypoglycemia.  She has gained a few lbs.      Objective:   Physical Exam VITAL SIGNS:  See vs page GENERAL: no distress NECK: There is no palpable thyroid enlargement.  No thyroid nodule is palpable.  No palpable lymphadenopathy at the anterior neck.   cbg record is reviewed and scanned into epic     Assessment & Plan:  Type 1 DM: Based on the pattern of her cbg's, she needs some adjustment in her pump therapy Pregnancy: in this setting, she needs aggressive rx, even at the risk of hypoglycemia. Dental infection: this may be exacerbating her glycemic control, but pt says the infection is improving.

## 2013-06-12 NOTE — Patient Instructions (Addendum)
check your blood sugar 7 times a day: before and after the 3 meals.  also check if you have symptoms of your blood sugar being too high or too low.  please keep a record of the readings and bring it to your next appointment here.  please call us sooner if your blood sugar goes below 70, or if you have a lot of readings over 200.  Please reduce your basal rate to 0.7 units/hr, 24 hrs per day, and:  Increase your mealtime bolus to 1 unit/ 11 grams carbohydrate, and:  A correction bolus (which some people call "sensitivity," or "insulin sensitivity ratio," or just "isr") of 1 unit for each 40 by which your glucose exceeds 100.  Please come back for a follow-up appointment in 2 weeks.

## 2013-06-14 ENCOUNTER — Other Ambulatory Visit: Payer: Self-pay

## 2013-06-14 MED ORDER — GLUCOSE BLOOD VI STRP: ORAL_STRIP | Status: DC

## 2013-06-14 MED ORDER — ACCU-CHEK AVIVA DEVI: Status: DC

## 2013-06-14 NOTE — Telephone Encounter (Signed)
Received fax from pharmacy stating test trips are not covered. Preferred is [facility]. New rx sent. Pt's sister informed.

## 2013-06-26 ENCOUNTER — Ambulatory Visit (INDEPENDENT_AMBULATORY_CARE_PROVIDER_SITE_OTHER): Payer: Medicaid Other | Admitting: Endocrinology

## 2013-06-26 ENCOUNTER — Encounter: Payer: Self-pay | Admitting: Endocrinology

## 2013-06-26 VITALS — BP 120/76 | HR 116 | Temp 98.5°F | Ht 65.0 in | Wt 129.0 lb

## 2013-06-26 DIAGNOSIS — E109 Type 1 diabetes mellitus without complications: Secondary | ICD-10-CM

## 2013-06-26 MED ORDER — GLUCOSE BLOOD VI STRP: ORAL_STRIP | Status: AC

## 2013-06-26 NOTE — Progress Notes (Signed)
   Subjective:    Patient ID: Taylor Warren, female    DOB: 06/25/1985, 28 y.o.   MRN: [id number]  HPI pt returns for f/u of type 1 DM (dx'ed 2006; she has mild if any neuropathy of the lower extremities; she is unaware of any associated chronic complications; she has been on insulin since dx; she has only had 1 episode severe hypoglycemia (mid-2014); she has never had DKA; she started on pump rx in early 2015, during her pregnancy).  Pt is approx [redacted] weeks pregnant, according to her dates.  She takes multiple basal rates, varying from 0.6-0.8 units per hr.   A mealtime bolus of 1 unit/ 11 grams carbohydrate, and:  A correction bolus (which some people call "sensitivity," or "insulin sensitivity ratio," or just "isr") of 1 unit for each 40 by which your glucose exceeds 100. she brings a record of her cbg's which i have reviewed today.  Her dental infection is resolved.  She says high cbg's are explained by anxiety.  Her dental infection is resolved.  She had an episode of n/v approx 1 week ago.  She feels she had a brief episode of LOC.  cbg was not checked then, but sxs resolved with oral glucose.  She has gained 15 lbs with the pregnancy so far.  Her total insulin dosage is approx 37 units per day.  Past Medical History  Diagnosis Date  . Diabetes mellitus without complication   . Anemia   . Urinary tract infection     pt still taking medication at this time    No past surgical history on file.  History   Social History  . Marital Status: Married    Spouse Name: N/A    Number of Children: N/A  . Years of Education: N/A   Occupational History  . Not on file.   Social History Main Topics  . Smoking status: Never Smoker   . Smokeless tobacco: Never Used  . Alcohol Use: No  . Drug Use: No  . Sexual Activity: Yes   Other Topics Concern  . Not on file   Social History Narrative  . No narrative on file    Current Outpatient Prescriptions on File Prior to Visit  Medication  Sig Dispense Refill  . ferrous sulfate 325 (65 FE) MG tablet Take 325 mg by mouth daily with breakfast.      . insulin aspart (NOVOLOG) 100 UNIT/ML injection For use in pump, for a total of 40 units per day  20 mL  11   No current facility-administered medications on file prior to visit.    No Known Allergies  No family history on file.  BP 120/76  Pulse 116  Temp(Src) 98.5 F (36.9 C) (Oral)  Ht 5\' 5"  (1.651 m)  Wt 129 lb (58.514 kg)  BMI 21.47 kg/m2  SpO2 95%  LMP 02/26/2013  Review of Systems She denies hypoglycemia and edema    Objective:   Physical Exam VITAL SIGNS:  See vs page GENERAL: no distress      Assessment & Plan:  DM: Based on the pattern of her cbg's, she needs some adjustment in her therapy Pregnancy: in this setting, she needs aggressive rx.

## 2013-06-26 NOTE — Patient Instructions (Addendum)
check your blood sugar 7 times a day: before and after the 3 meals.  also check if you have symptoms of your blood sugar being too high or too low.  please keep a record of the readings and bring it to your next appointment here.  please call us sooner if your blood sugar goes below 70, or if you have a lot of readings over 200.  Please change your basal rate to 0.7 units/hr, 24 hrs per day, and:  Increase your mealtime bolus to 1 unit/ 10 grams carbohydrate, and:  Increase your correction bolus (which some people call "sensitivity," or "insulin sensitivity ratio," or just "isr") to 1 unit for each 30 by which your glucose exceeds 100.   Please come back for a follow-up appointment in 2 weeks.

## 2013-06-28 ENCOUNTER — Ambulatory Visit (INDEPENDENT_AMBULATORY_CARE_PROVIDER_SITE_OTHER): Payer: Medicaid Other | Admitting: Advanced Practice Midwife

## 2013-06-28 ENCOUNTER — Encounter: Payer: Self-pay | Admitting: Advanced Practice Midwife

## 2013-06-28 ENCOUNTER — Other Ambulatory Visit: Payer: Self-pay

## 2013-06-28 DIAGNOSIS — Z3400 Encounter for supervision of normal first pregnancy, unspecified trimester: Secondary | ICD-10-CM

## 2013-06-28 LAB — POCT URINALYSIS DIP (DEVICE)
Bilirubin Urine: NEGATIVE
GLUCOSE, UA: NEGATIVE mg/dL
Hgb urine dipstick: NEGATIVE
Ketones, ur: NEGATIVE mg/dL
LEUKOCYTES UA: NEGATIVE
NITRITE: NEGATIVE
PROTEIN: NEGATIVE mg/dL
Specific Gravity, Urine: 1.025 (ref 1.005–1.030)
Urobilinogen, UA: 0.2 mg/dL (ref 0.0–1.0)
pH: 6 (ref 5.0–8.0)

## 2013-06-28 NOTE — Progress Notes (Signed)
Patient not seen in clinic today. At check in she informed me that she already has an obgyn and is receiving care in that office. She stated that she didn't need care from our office. Patient left clinic.

## 2013-07-08 ENCOUNTER — Other Ambulatory Visit: Payer: Self-pay

## 2013-07-09 ENCOUNTER — Other Ambulatory Visit: Payer: Self-pay | Admitting: Obstetrics and Gynecology

## 2013-07-09 DIAGNOSIS — O289 Unspecified abnormal findings on antenatal screening of mother: Secondary | ICD-10-CM

## 2013-07-16 ENCOUNTER — Ambulatory Visit (HOSPITAL_COMMUNITY)
Admission: RE | Admit: 2013-07-16 | Discharge: 2013-07-16 | Disposition: A | Discharge status: Home / Self Care | Payer: Medicaid Other | Source: Ambulatory Visit | Attending: Obstetrics and Gynecology | Admitting: Obstetrics and Gynecology

## 2013-07-16 ENCOUNTER — Other Ambulatory Visit: Payer: Self-pay

## 2013-07-16 ENCOUNTER — Other Ambulatory Visit: Payer: Self-pay | Admitting: Obstetrics and Gynecology

## 2013-07-16 ENCOUNTER — Telehealth (HOSPITAL_COMMUNITY): Payer: Self-pay | Admitting: MS"

## 2013-07-16 DIAGNOSIS — O289 Unspecified abnormal findings on antenatal screening of mother: Secondary | ICD-10-CM

## 2013-07-16 DIAGNOSIS — O351XX0 Maternal care for (suspected) chromosomal abnormality in fetus, not applicable or unspecified: Secondary | ICD-10-CM | POA: Insufficient documentation

## 2013-07-16 DIAGNOSIS — O3510X0 Maternal care for (suspected) chromosomal abnormality in fetus, unspecified, not applicable or unspecified: Secondary | ICD-10-CM | POA: Insufficient documentation

## 2013-07-16 DIAGNOSIS — IMO0002 Reserved for concepts with insufficient information to code with codable children: Secondary | ICD-10-CM | POA: Insufficient documentation

## 2013-07-16 NOTE — Progress Notes (Addendum)
Genetic Counseling  High-Risk Gestation Note  Appointment Date:  07/16/2013 Referred By: Purcell Nailsoberts, [name], MD Date of Birth:  09/29/1985 Partner:  Taylor Warren    Pregnancy History: G1P0 Estimated Date of Delivery: 12/03/13 Estimated Gestational Age: 452w0d Attending: Particia NearingMartha Decker, MD    Taylor Warren and her partner, Taylor Warren, were seen for genetic counseling because of an increased risk for fetal Down syndrome based on Quad screening through [facility]. English/Arabic medical interpreter from [employer] provided interpretation for today's visit.   They were counseled regarding the Quad screen result and the associated 1 in 176 risk for fetal Down syndrome. (We discussed that initially, the Quad screen indicated a 1 in 60 risk for fetal Down syndrome but was recalculated to include the patient's medical history of insulin dependent diabetes.)  We reviewed chromosomes, nondisjunction, and the common features and variable prognosis of Down syndrome.  In addition, we reviewed the screen adjusted reduction in risks for trisomy 18  and ONTDs.  We also discussed other explanations for a screen positive result including: differences in maternal metabolism and normal variation. They understand that this screening is not diagnostic for Down syndrome but provides a risk assessment.  Taylor Warren had detailed ultrasound today. We reviewed that were no visualized fetal anomalies or markers suggestive of aneuploidy. Complete ultrasound results reported separately. They understand that ultrasound cannot diagnose or rule out Down syndrome, nor can it diagnose or rule out all birth defects or genetic conditions.   We reviewed available screening options including noninvasive prenatal screening (NIPS)/cell free DNA (cfDNA) testing and detailed ultrasound.  They were counseled that screening tests are used to modify a patient's a priori risk for aneuploidy, typically  based on age. This estimate provides a pregnancy specific risk assessment. We reviewed the benefits and limitations of each option. Specifically, we discussed the conditions for which each test screens, the detection rates, and false positive rates of each. They were also counseled regarding diagnostic testing via amniocentesis. We reviewed the approximate 1 in 300-500 risk for complications for amniocentesis, including spontaneous pregnancy loss. The couple is aware that the legal limit for termination of pregnancy in [place] is [redacted] weeks gestation.   After consideration of all the options, she elected to proceed with NIPS (Panorama) at the time of today's visit.  Those results will be available in 8-10 days.  Diagnostic testing was declined today.  They understand that screening tests cannot rule out all birth defects or genetic syndromes. The patient was advised of this limitation and states she still does not want additional testing at this time.   Both family histories were reviewed and found to be contributory for a congenital heart anomaly for Mr. [name]'s brother. He did not know the name of the specific heart anomaly, but reported that no surgery was required. This individual is reportedly healthy. Congenital heart defects occur in approximately 1% of pregnancies.  Congenital heart defects may occur due to multifactorial influences, chromosomal abnormalities, genetic syndromes or environmental exposures.  Isolated heart defects are generally multifactorial.  Given the reported family history and assuming multifactorial inheritance, the risk for a congenital heart defect in the current pregnancy (a second degree relative) would be approximately 1-2%.   Without further information regarding the provided family history, an accurate genetic risk cannot be calculated. Further genetic counseling is warranted if more information is obtained.  Taylor Warren denied exposure to environmental  toxins or chemical agents. She denied the use of alcohol,  tobacco or street drugs. She denied significant viral illnesses during the course of her pregnancy. Her medical and surgical histories were contributory insulin dependent diabetes.   I counseled this couple for approximately 45 minutes regarding the above risks and available options.   Taylor AppKaren Louise Jeni SallesEch Demitria Hay, MS,  Certified Genetic Counselor  07/16/2013   Addendum: Jeanene Erballed Taylor Warren via Clinical research associateArabic Interpreter [phone] via [employer]. Her OB office called us back this afternoon at approximately 4:45 pm to say that she had positive Panorama result through their office, >99% Down syndrome risk. Explained to the patient that when we called her OB office this am and inquired about this being drawn, given that patient stated this blood work was drawn on 07/08/13, the person at her OB office stated that the patient did not have labs drawn on 07/08/13. Apologized to the patient that there was a miscommunication and that we did not have that information when she was here this morning. Given that her OB office informed us that she had not had labs drawn on 07/08/13, we drew NIPS today. However, discussed with patient that we will cancel her lab from today from our office given that it was Panorama, which has already been performed.   Reviewed that NIPS is more accurate than Quad screen, but is still not diagnostic. Briefly discussed that the positive predictive value of this screen would be ~98% for the patient. Reviewed the option of amniocentesis. The patient stated that she would like to pursue amniocentesis and will come in tomorrow. She is planning to come in at 8:15 am on 07/17/13 for amniocentesis. Discussed the option of follow-up genetic counseling either tomorrow or a different day this week. Patient did not have additional questions at this time.

## 2013-07-16 NOTE — Telephone Encounter (Signed)
Called Ms. Taylor Warren via Clinical research associateArabic [employer]preter [phone] via [employer]. Her OB office called us back this afternoon to say that she had positive Panorama result through their office, >99% Down syndrome risk. Explained to the patient that when we called her OB office this am and inquired about this being drawn, given that patient stated this blood work was drawn on 07/08/13, the person at her OB office stated that the patient did not have labs drawn on 07/08/13. Apologized to the patient that there was a miscommunication and that we did not have that information when she was here this morning. Given that her OB office informed us that she had not had labs drawn on 07/08/13, we drew NIPS today. However, discussed with patient that we will cancel her lab from today from our office given that it was Panorama, which has already been performed.   Reviewed that NIPS is more accurate than Quad screen, but is still not diagnostic. Briefly discussed that the positive predictive value of this screen would be ~98% for the patient. Reviewed the option of amniocentesis. The patient stated that she would like to pursue amniocentesis and will come in tomorrow. She is planning to come in at 8:15 am on 07/17/13 for amniocentesis. Discussed the option of follow-up genetic counseling either tomorrow or a different day this week. Patient did not have additional questions at this time.   Taylor AppKaren Louise Ech Warren Warren 07/16/2013 5:25 PM

## 2013-07-17 ENCOUNTER — Other Ambulatory Visit: Payer: Self-pay

## 2013-07-17 ENCOUNTER — Ambulatory Visit (HOSPITAL_COMMUNITY)
Admission: RE | Admit: 2013-07-17 | Discharge: 2013-07-17 | Disposition: A | Discharge status: Home / Self Care | Payer: Medicaid Other | Source: Ambulatory Visit | Attending: Obstetrics and Gynecology | Admitting: Obstetrics and Gynecology

## 2013-07-17 DIAGNOSIS — O289 Unspecified abnormal findings on antenatal screening of mother: Secondary | ICD-10-CM

## 2013-07-17 DIAGNOSIS — Z3689 Encounter for other specified antenatal screening: Secondary | ICD-10-CM | POA: Insufficient documentation

## 2013-07-17 LAB — AP-AFP (ALPHA FETOPROTEIN)

## 2013-07-17 LAB — ROUTINE CHROMOSOME - KARYOTYPE + FISH

## 2013-07-18 ENCOUNTER — Telehealth (HOSPITAL_COMMUNITY): Payer: Self-pay | Admitting: MS"

## 2013-07-18 NOTE — Telephone Encounter (Signed)
Called Ms. Ewa Gaumer via [employer]/English medical interpreter through [employer]. Called to update regarding the FISH results from her amniocentesis on 07/16/13. St[facility] that [facility] but per discussion with the lab, should be ready tomorrow morning. Discussed with the patient that I expected to hear from the lab with the results prior to lunch tomorrow and will call her as soon as I hear from the lab. Patient stated that she has not had concerning symptoms following the amniocentesis procedure.    Helyn AppKaren Louise Ech Simi Briel 07/18/2013 4:30 PM

## 2013-07-19 ENCOUNTER — Telehealth (HOSPITAL_COMMUNITY): Payer: Self-pay | Admitting: MS"

## 2013-07-19 ENCOUNTER — Other Ambulatory Visit: Payer: Self-pay

## 2013-07-19 NOTE — Telephone Encounter (Signed)
Called Taylor Warren back with Arabic/English interpreter [phone] through [employer]. Her sister, Taylor Warren, answered the phone. Discussed with Taylor Warren that [facility] is able to perform termination of pregnancy for patient, if desired. She would need to complete consent over the phone today for the procedure, and they would see her on Tuesday of next week to place laminaria. Reviewed that the estimated cost for procedure is $5500-7000. They require a deposit of $350, and can also discuss possible financial aid options, including possible funds that she could apply for. Reviewed with Taylor Warren that the patient is free to change her mind regarding the procedure at any point, even after completing the consent or at time of her appointment, for example. Taylor Warren was with Taylor Warren and relayed this information to her as I waited on the phone. Taylor Warren stated that Taylor Warren would like to proceed at this point but understands that she can change her mind. Discussed that an individual from [facility] will call her today with an Arabic interpreter to go over the TOP consent. Taylor Warren asked about risks to the patient. I stated that the individual who calls can answer those questions as well. I stated that I will update Taylor Warren OB regarding the plan and that if Taylor Warren changes her mind and continues the pregnancy for her to call Dr. Su Hilt' office to let them know. Taylor Warren stated that she nor her sister had additional questions at this time.   [name] 07/19/2013 12:25 PM

## 2013-07-19 NOTE — Telephone Encounter (Signed)
Called patient's OB, Dr. Su Hilt' office to update regarding abnormal FISH result and that patient is leaning towards termination of pregnancy. Discussed that we will try to facilitate through [facility], if patient desires this option and that we will keep OB office updated on what is determined by the patient. Spoke with Dr. Su Hilt' nurse, Bonita Quin.   Helyn App Ech Garyson Stelly 07/19/2013 10:24 AM

## 2013-07-19 NOTE — Telephone Encounter (Signed)
Called Ms. Chanon Lowder regarding FISH results from her amniocentesis performed on 07/17/13. Arabic/English interpretation provided via telephone through [employer] [phone]. Discussed with Ms. 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 Reviewed that final karyotype analysis is still pending and will be available in approximately 1 week. Patient was upset on the phone. We reviewed her pregnancy options. Patient had previously stated that she was interested in termination of pregnancy. Reviewed that she is currently [redacted]w[redacted]d, and that we are not able to help with this. However, discussed that [facility] may be able to facilitate the procedure for her and that I can call and inquire. Also reviewed the cost of TOP and that this is not covered by Medicaid, but that facilities, such as [facility] may possibly have financial assistance available. If [facility] is able to perform procedure, I discussed with the patient that somebody from their facility could call her and discuss cost with her. I also informed the patient that termination of pregnancy would be available out of state, including centers in [place] and [place] and that I can provide her with this information also. Alternatively, we reviewed that some individuals choose to continue the pregnancy and place the baby for adoption given that there are families in the [place] that desire to adopt a baby with Down syndrome. I also reviewed that for individuals who continue the pregnancy, there are various resources available to families and that children with Down syndrome qualify for services provided through the state such as physical, occupational, and speech therapies. The patient became upset and asked for me to speak with her sister, Quinn Plowman, who speaks [place]. Her sister inquired if we can help facilitate termination of pregnancy for the patient, given that this is likely how she wants to  proceed. I reviewed the time sensitive nature of this decision but also reviewed that given the out of state options, she does not have to reach this decision today, if she needs additional time to consider. Nala planned to talk more with her sister, and they will call our office back today. They had no additional questions at this time.   Helyn App Ech Ceara Wrightson 07/19/2013 10:20 AM

## 2013-07-23 ENCOUNTER — Telehealth (HOSPITAL_COMMUNITY): Payer: Self-pay | Admitting: MS"

## 2013-07-23 NOTE — Telephone Encounter (Signed)
Called and left message for [facility], triage nurse with [facility]. Stated in message that I was calling regarding Dr. Su Hilt' patient, Taylor Warren, whom we spoke about on Friday. Patient was referred to [facility] for termination of pregnancy and was being scheduled to come to [facility] on Tuesday (today). Per my discussion with the patient on Friday, she was planning to proceed with this plan. I informed the patient and her sister that if patient changes her mind and elects to continue pregnancy, then she should call her OB office to inform them. Bonita Quin was encouraged to call back with questions.   Helyn App Ech Cass Edinger 07/23/2013

## 2013-07-30 ENCOUNTER — Telehealth (HOSPITAL_COMMUNITY): Payer: Self-pay | Admitting: MS"

## 2013-07-30 NOTE — Telephone Encounter (Signed)
Called Taylor Warren to discuss the final karyotype result from her amniocentesis. Medical Arabic/English interpreter [phone] through [employer] system provided interpretation over the telephone. 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 This is denoted by 24, XY, +21. Reviewed that recurrence risk for a future pregnancy is approximately 1%. This recurrence risk will change with maternal age. Reviewed with the patient that in a future pregnancy various screening and testing options would be available to her, including screens and tests in the first trimester, such as noninvasive prenatal screening (NIPS) and chorionic villus sampling. Discussed that we can review all first and second trimester screening and testing options again the future in more detail, if desired. [name] asked if she or her husband should have their blood tested prior to conception for compatibility. Explained that additional blood tests for the patient or her husband would not be expected to refine recurrence risk for Down syndrome in a future pregnancy, given that trisomy 21 occurred at conception. I also inquired about how the patient is doing. She stated that she and her husband are doing OK and that they have supportive friends and family around them. All questions were answered to her satisfaction, she was encouraged to call with additional questions or concerns.  Helyn App Jeni Salles, MS Certified Genetic Counselor

## 2013-08-08 ENCOUNTER — Encounter: Payer: Self-pay | Admitting: Internal Medicine

## 2013-08-08 ENCOUNTER — Ambulatory Visit: Payer: Medicaid Other | Attending: Internal Medicine | Admitting: Internal Medicine

## 2013-08-08 VITALS — BP 124/79 | HR 101 | Temp 98.3°F | Resp 16 | Ht 64.0 in | Wt 134.0 lb

## 2013-08-08 DIAGNOSIS — E109 Type 1 diabetes mellitus without complications: Secondary | ICD-10-CM | POA: Diagnosis not present

## 2013-08-08 DIAGNOSIS — R21 Rash and other nonspecific skin eruption: Secondary | ICD-10-CM

## 2013-08-08 DIAGNOSIS — D649 Anemia, unspecified: Secondary | ICD-10-CM

## 2013-08-08 LAB — CBC
HEMATOCRIT: 38.9 % (ref 36.0–46.0)
HEMOGLOBIN: 13.1 g/dL (ref 12.0–15.0)
MCH: 27.3 pg (ref 26.0–34.0)
MCHC: 33.7 g/dL (ref 30.0–36.0)
MCV: 81 fL (ref 78.0–100.0)
Platelets: 303 10*3/uL (ref 150–400)
RBC: 4.8 MIL/uL (ref 3.87–5.11)
RDW: 13.8 % (ref 11.5–15.5)
WBC: 6.6 10*3/uL (ref 4.0–10.5)

## 2013-08-08 LAB — GLUCOSE, POCT (MANUAL RESULT ENTRY): POC Glucose: 92 mg/dl (ref 70–99)

## 2013-08-08 LAB — POCT GLYCOSYLATED HEMOGLOBIN (HGB A1C): Hemoglobin A1C: 6

## 2013-08-08 MED ORDER — CETIRIZINE HCL 10 MG PO TABS: 10.0000 mg | ORAL_TABLET | Freq: Every day | ORAL | Status: AC

## 2013-08-08 MED ORDER — TRIAMCINOLONE ACETONIDE 0.1 % EX CREA: 1.0000 "application " | TOPICAL_CREAM | Freq: Two times a day (BID) | CUTANEOUS | Status: AC

## 2013-08-08 MED ORDER — FERROUS SULFATE 325 (65 FE) MG PO TABS: 325.0000 mg | ORAL_TABLET | Freq: Three times a day (TID) | ORAL | Status: AC

## 2013-08-08 MED ORDER — INSULIN ASPART 100 UNIT/ML ~~LOC~~ SOLN: SUBCUTANEOUS | Status: DC

## 2013-08-08 NOTE — Patient Instructions (Signed)

## 2013-08-08 NOTE — Progress Notes (Signed)
Miscarriage Jul 25, 2013.  Patient c/o being constantly dizzy. Patient states she needs a refill on insulin for pump. Patient has rash on right index finger.

## 2013-08-08 NOTE — Progress Notes (Signed)
Patient ID: Taylor Warren, female   DOB: 07/01/1985, 28 y.o.   MRN: [id number]  CC: dizziness  HPI: Patient reports that she had a miscarriage 2 weeks ago and has been having increased dizziness since then.  Patient reports that she is fatigued, bloated, tachycardic, and dizzy upon awaking.  She reports these symptoms for greater than 6 months.  She denies iron use in over six months and prenatal vitamin use.  She denies vaginal bleeding.  She also c/o a rash on her  left hand, pruritic, for two days ago. Reports that rash comes every spring.    No Known Allergies Past Medical History  Diagnosis Date  . Diabetes mellitus without complication   . Anemia   . Urinary tract infection     pt still taking medication at this time   Current Outpatient Prescriptions on File Prior to Visit  Medication Sig Dispense Refill  . glucose blood (BAYER CONTOUR TEST) test strip Use 6 times per day, and lancets.  Variable cbg's, pump rx, and pregnancy.  180 each  12  . insulin aspart (NOVOLOG) 100 UNIT/ML injection For use in pump, for a total of 40 units per day  20 mL  11  . ferrous sulfate 325 (65 FE) MG tablet Take 325 mg by mouth daily with breakfast.       No current facility-administered medications on file prior to visit.   No family history on file. History   Social History  . Marital Status: Married    Spouse Name: N/A    Number of Children: N/A  . Years of Education: N/A   Occupational History  . Not on file.   Social History Main Topics  . Smoking status: Never Smoker   . Smokeless tobacco: Never Used  . Alcohol Use: No  . Drug Use: No  . Sexual Activity: Yes   Other Topics Concern  . Not on file   Social History Narrative  . No narrative on file   Review of Systems  Constitutional: Positive for malaise/fatigue. Negative for fever, chills and diaphoresis.  HENT: Negative.   Eyes: Negative.   Respiratory: Negative.   Cardiovascular: Negative.   Skin: Positive for itching  and rash (right hand ).  Neurological: Positive for dizziness and weakness.      Objective:   Filed Vitals:   08/08/13 1226  BP: 124/79  Pulse: 101  Temp: 98.3 F (36.8 C)  Resp: 16    Physical Exam: Constitutional: Patient appears well-developed and well-nourished. No distress. HENT: Normocephalic, atraumatic, External right and left ear normal. Oropharynx is clear and moist.  Eyes: Conjunctivae and EOM are normal. PERRLA, no scleral icterus. Neck: Normal ROM. Neck supple. No JVD. No tracheal deviation. No thyromegaly. CVS: RRR, S1/S2 +, no murmurs, no gallops, no carotid bruit.  Pulmonary: Effort and breath sounds normal, no stridor, rhonchi, wheezes, rales.  Abdominal: Soft. BS +,  no distension, tenderness, rebound or guarding.  Musculoskeletal: Normal range of motion. No edema and no tenderness.  Lymphadenopathy: No lymphadenopathy noted, cervical, Neuro: Alert. Normal reflexes, muscle tone coordination. No cranial nerve deficit. Skin: Skin is warm and dry.  Not diaphoretic. Right hand erythematous papules Psychiatric: Normal mood and affect. Behavior, judgment, thought content normal.  Lab Results  Component Value Date   WBC 10.3 10/10/2012   HGB 13.9 12/05/2012   HCT 41.0 12/05/2012   MCV 65.0* 10/10/2012   PLT 258 10/10/2012   Lab Results  Component Value Date   CREATININE 0.6 04/26/2013  BUN 11 04/26/2013   NA 135 04/26/2013   K 4.5 04/26/2013   CL 102 04/26/2013   CO2 27 04/26/2013    Lab Results  Component Value Date   HGBA1C 9.1* 04/26/2013   Lipid Panel  No results found for this basename: chol, trig, hdl, cholhdl, vldl, ldlcalc       Assessment and plan:   Magon was seen today for dizziness.  Diagnoses and associated orders for this visit:  Type I (juvenile type) diabetes mellitus without mention of complication, not stated as uncontrolled - POCT A1C - Glucose (CBG) - Basic Metabolic Panel - insulin aspart (NOVOLOG) 100 UNIT/ML injection; For  use in pump, for a total of 40 units per day. Patient has insulin pump and is managed by endocrinology. A1c of 6.0  Anemia - ferrous sulfate 325 (65 FE) MG tablet; Take 1 tablet (325 mg total) by mouth 3 (three) times daily with meals. Educated patient that she may need stool softer for constipation secondary to iron use  - CBC  Rash and nonspecific skin eruption - triamcinolone cream (KENALOG) 0.1 %; Apply 1 application topically 2 (two) times daily. Do not apply to face - cetirizine (ZYRTEC) 10 MG tablet; Take 1 tablet (10 mg total) by mouth daily.   Return in about 3 months (around 11/08/2013) for anemia.   Holland Commons, NP-C [facility] [phone] 08/09/2013, 12:44 PM

## 2013-08-09 ENCOUNTER — Telehealth: Payer: Self-pay | Admitting: *Deleted

## 2013-08-09 LAB — BASIC METABOLIC PANEL
BUN: 13 mg/dL (ref 6–23)
CHLORIDE: 105 meq/L (ref 96–112)
CO2: 22 meq/L (ref 19–32)
CREATININE: 0.6 mg/dL (ref 0.50–1.10)
Calcium: 9.3 mg/dL (ref 8.4–10.5)
GLUCOSE: 103 mg/dL — AB (ref 70–99)
Potassium: 4 mEq/L (ref 3.5–5.3)
Sodium: 140 mEq/L (ref 135–145)

## 2013-08-09 NOTE — Telephone Encounter (Signed)
Message copied by Raynelle CharyWINFREE, Elisabeth Strom R on Fri Aug 09, 2013  4:52 PM ------      Message from: Ambrose FinlandKECK, VALERIE A      Created: Fri Aug 09, 2013  3:02 PM       Let patient know her anemia has improved. If the dizziness does not improve or if she develops fever she will need to come back to be seen. Thanks ------

## 2013-08-09 NOTE — Telephone Encounter (Signed)
Pt is aware of her lab results and the doctors instructions.

## 2013-09-09 ENCOUNTER — Telehealth: Payer: Self-pay | Admitting: Internal Medicine

## 2013-09-09 ENCOUNTER — Telehealth: Payer: Self-pay | Admitting: Endocrinology

## 2013-09-09 DIAGNOSIS — E109 Type 1 diabetes mellitus without complications: Secondary | ICD-10-CM

## 2013-09-09 MED ORDER — INSULIN ASPART 100 UNIT/ML ~~LOC~~ SOLN: SUBCUTANEOUS | Status: AC

## 2013-09-09 NOTE — Telephone Encounter (Signed)
Refill x 2 months. Ov is due 

## 2013-09-09 NOTE — Telephone Encounter (Signed)
Patient is requesting refill on med, Novalog

## 2013-09-09 NOTE — Telephone Encounter (Signed)
See below and please advise if ok to refill. Last refill was made by NP Holland CommonsValerie Keck. Thanks!

## 2013-09-09 NOTE — Telephone Encounter (Signed)
Pt. Needs Doctor's note about her diagnostic since she will be in [place] for three months. Also, she needs refill for Novlog 10 unit/ml for the time she will be out of the country. Please, f/u with Pt., she will travel on July 16.

## 2013-09-09 NOTE — Telephone Encounter (Signed)
Rx sent to pharmacy   

## 2013-10-18 ENCOUNTER — Telehealth: Payer: Self-pay | Admitting: *Deleted

## 2013-10-18 NOTE — Telephone Encounter (Signed)
Letter sent to patient to call office and schedule an appointment to follow up with diabetes.

## 2013-11-08 ENCOUNTER — Ambulatory Visit: Payer: Medicaid Other | Admitting: Internal Medicine

## 2013-12-19 NOTE — Telephone Encounter (Signed)
error 

## 2013-12-30 ENCOUNTER — Encounter: Payer: Self-pay | Admitting: Internal Medicine

## 2015-04-13 IMAGING — CR DG SHOULDER 2+V*L*
3 series · 3 of 3 positions shown · non-contrast
Comparison: None.

CLINICAL DATA: Shoulder pain with no known injury

LEFT SHOULDER - 2+ VIEW

[w shoulder external left]
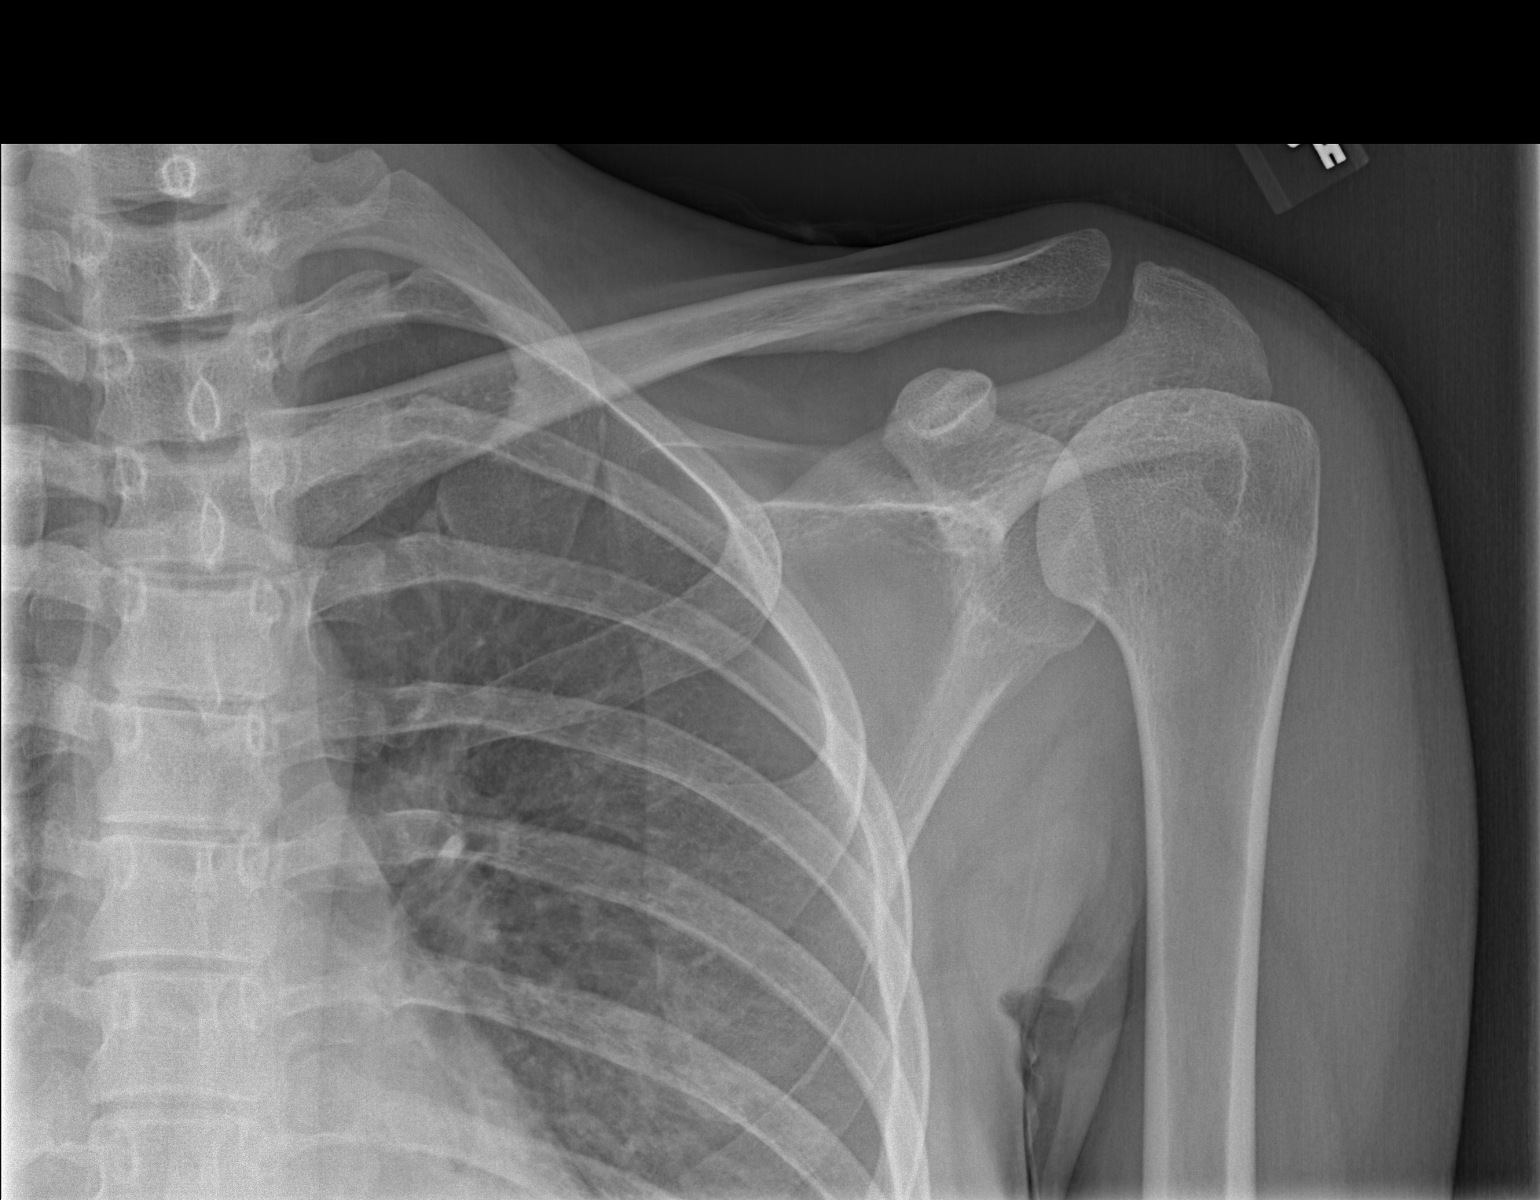

[w shoulder y-view left]
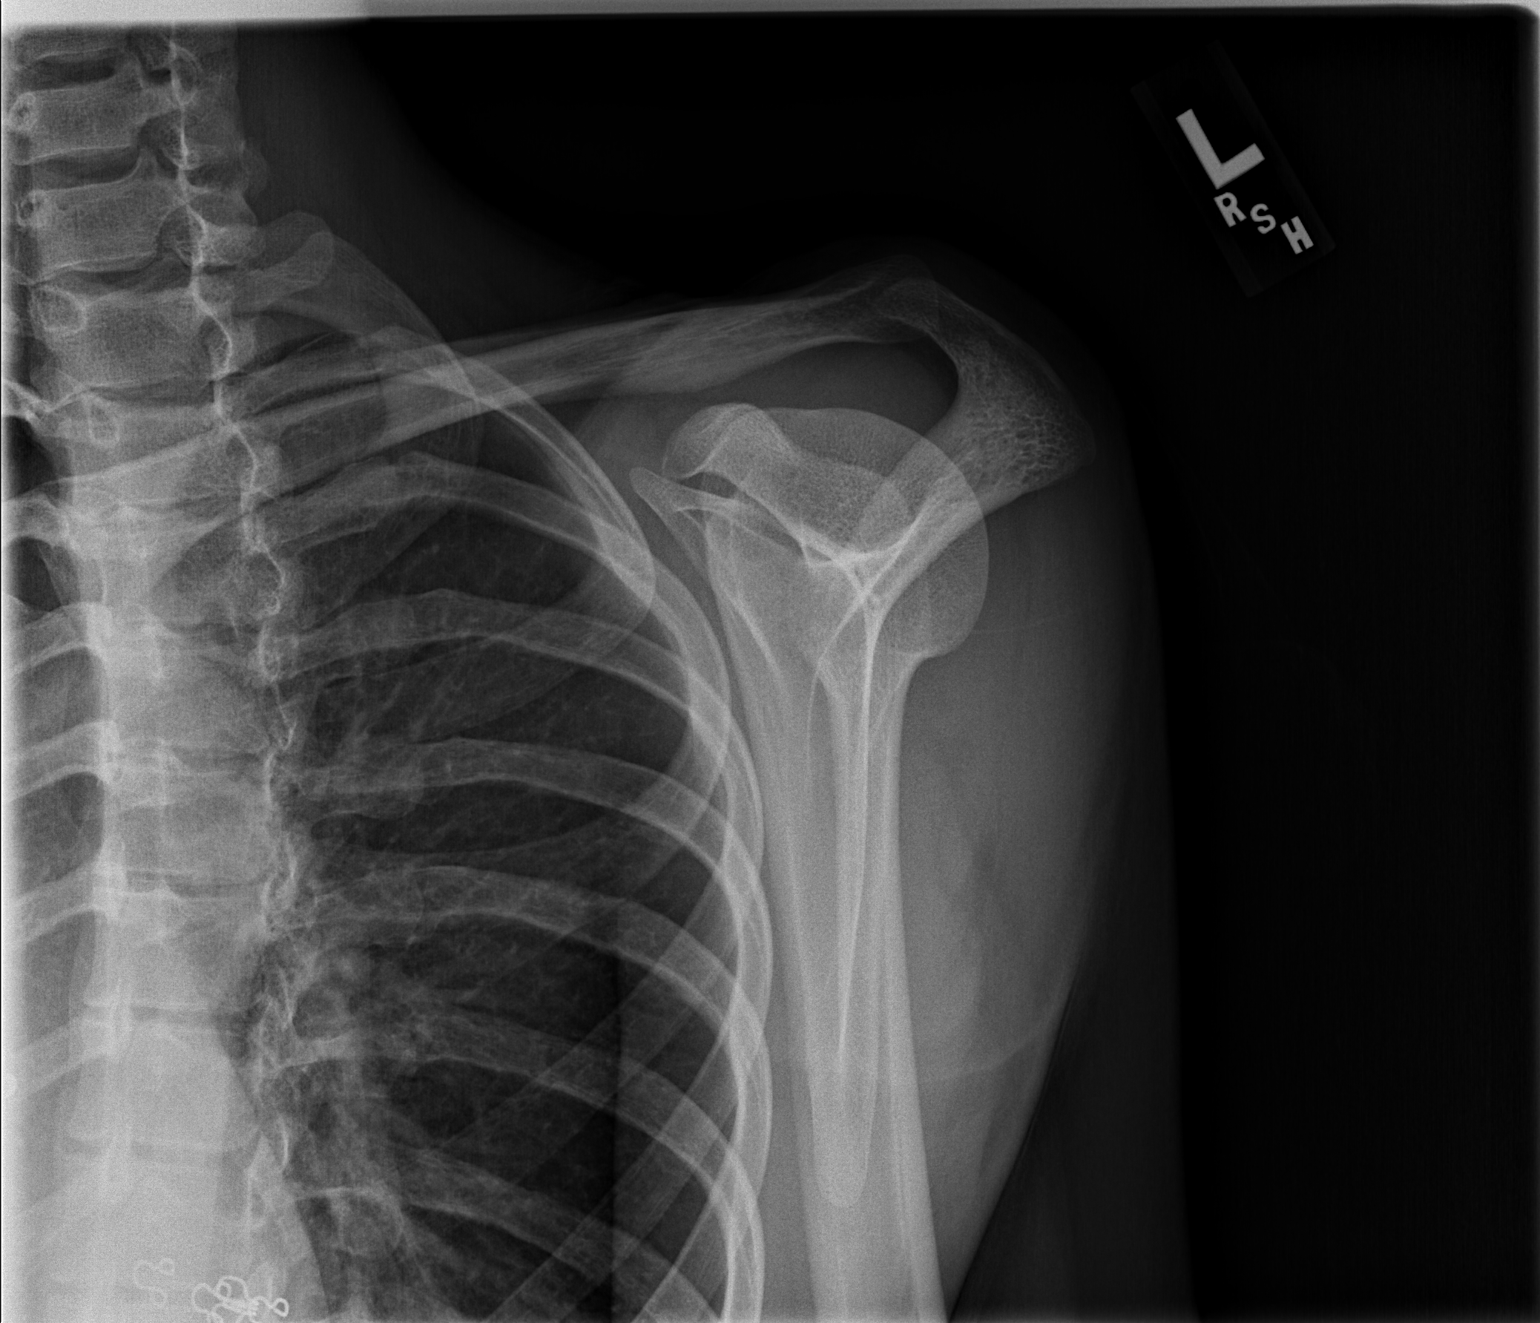

[x shoulder axillary left]
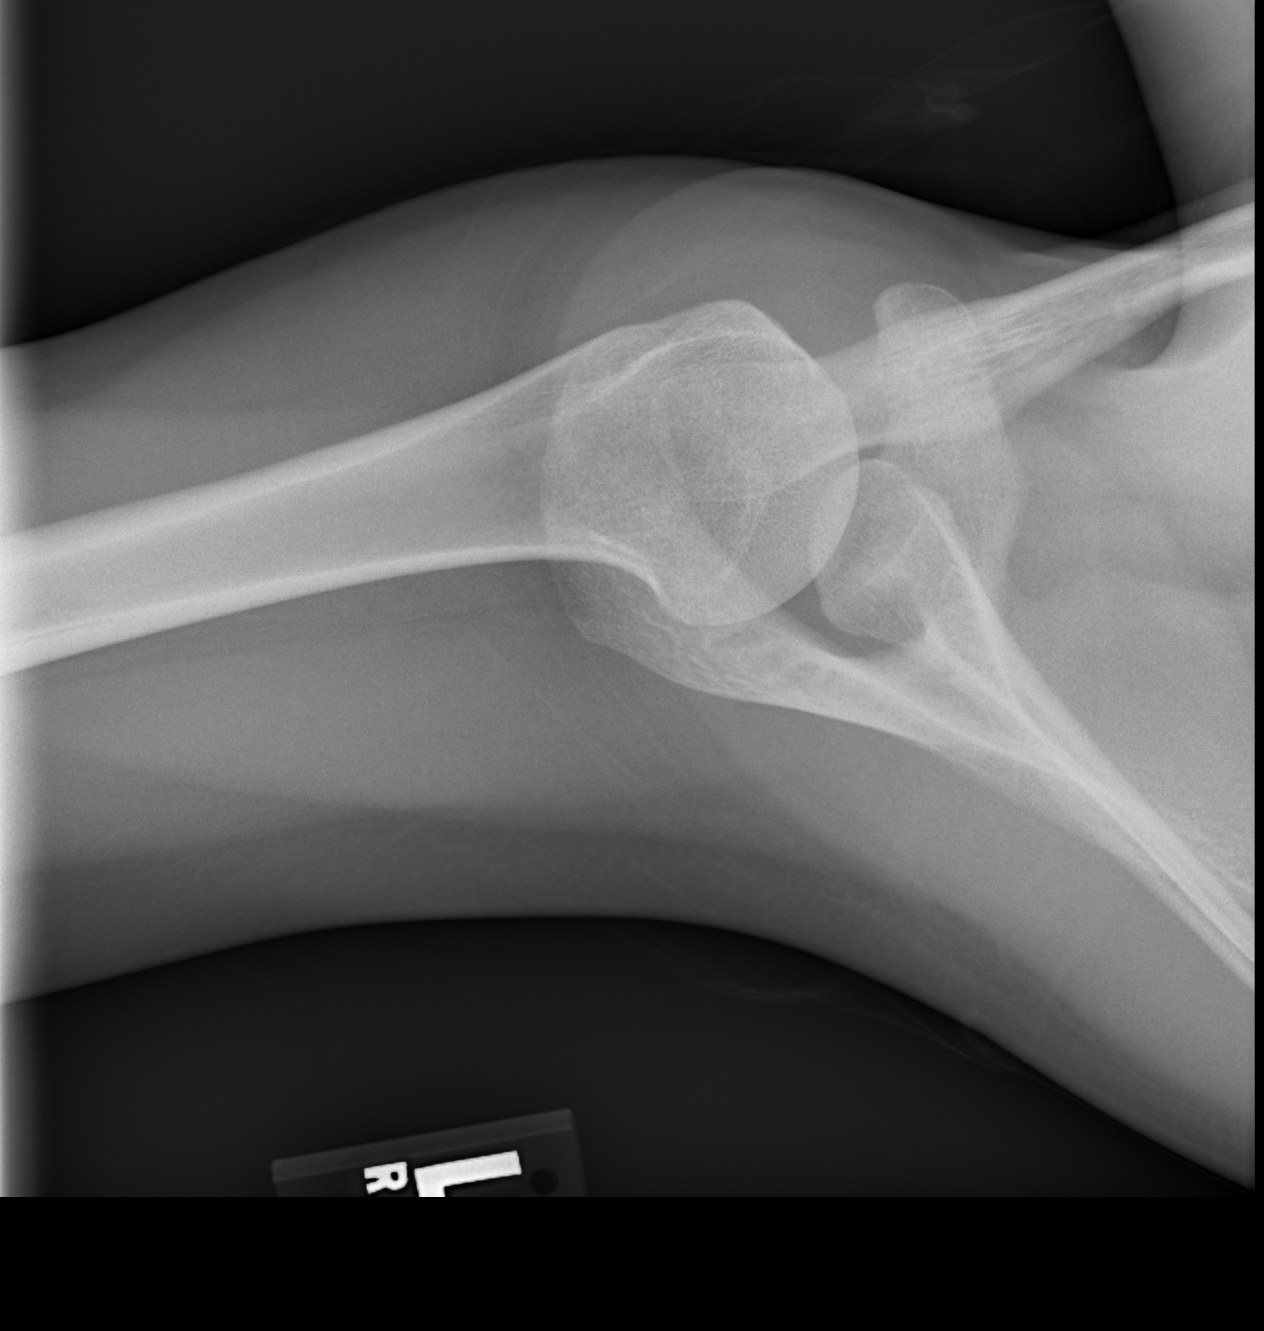

[3 of 3 positions shown; findings below may reference images not displayed]

FINDINGS: Bone density is within normal limits.  The glenohumeral
joint and acromioclavicular joints appear maintained.  No
periarticular calcification is seen to suggest tendinous or bursal
calcification.  Soft tissue planes appear maintained.  No
significant degenerative change is noted around the shoulder
girdle.

The visualized portion of the left lung field appears clear
IMPRESSION: Normal

## 2015-07-13 NOTE — Telephone Encounter (Signed)
left voicemail for Taylor Warren to call to reschedule appointment

## 2015-07-13 NOTE — Telephone Encounter (Signed)
[facility]

## 2016-02-09 IMAGING — US US AMNIOCENTESIS
1 series · 7 of 7 positions shown · non-contrast
Comparison: none

[Series 1: us amniocentesis · 0.23mm/px · 7 of 7 slices shown]
[im 1/7]
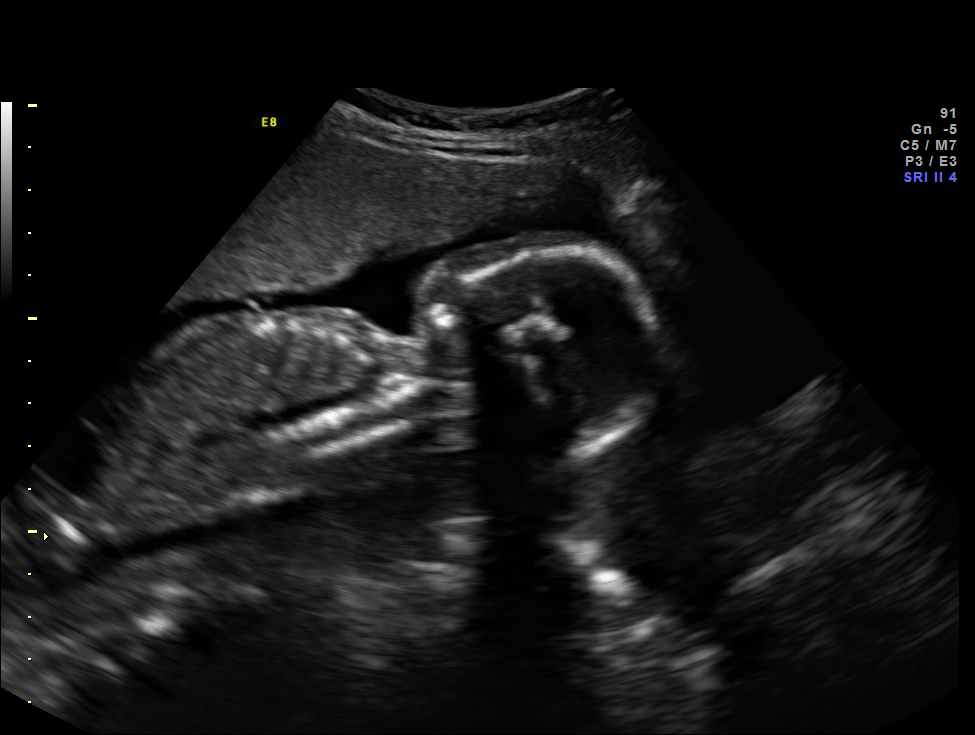
[im 2/7]
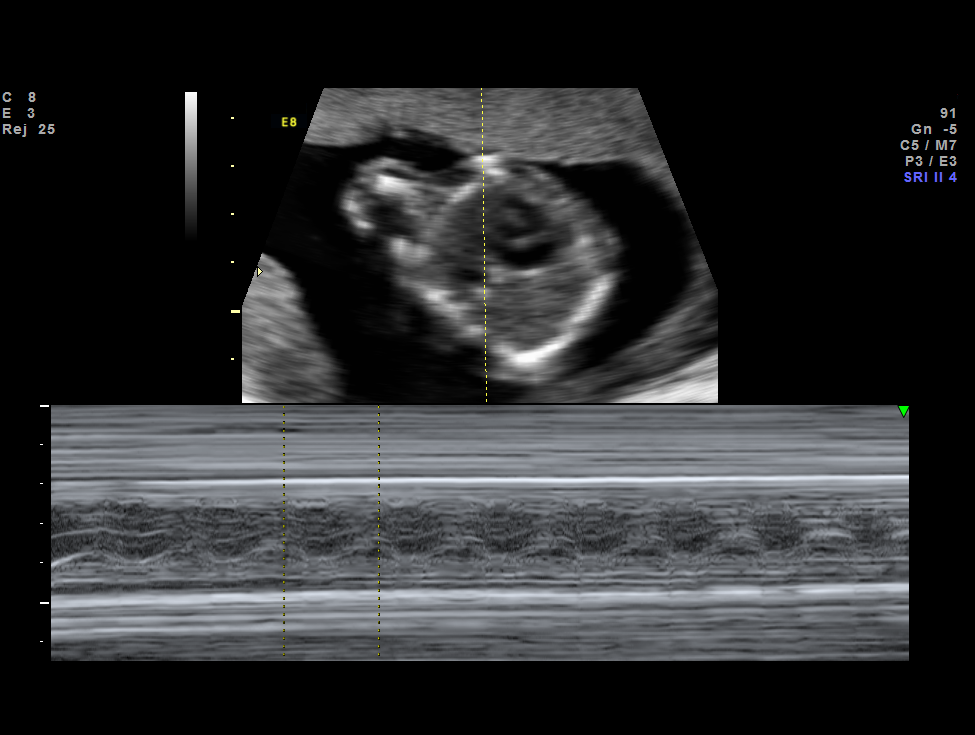
[im 3/7]
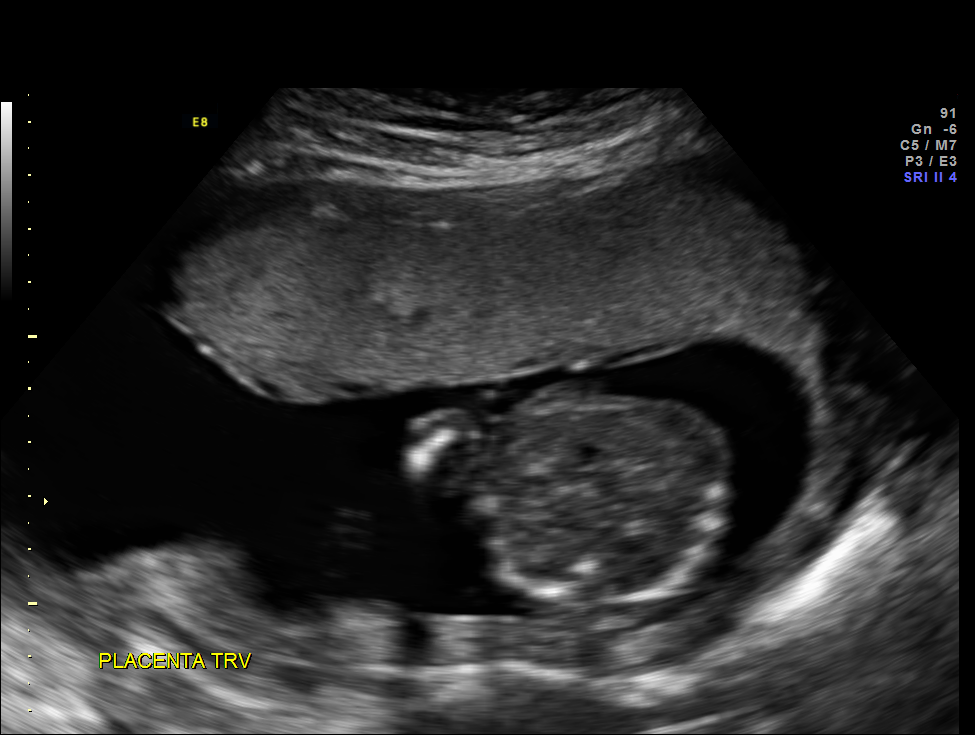
[im 4/7]
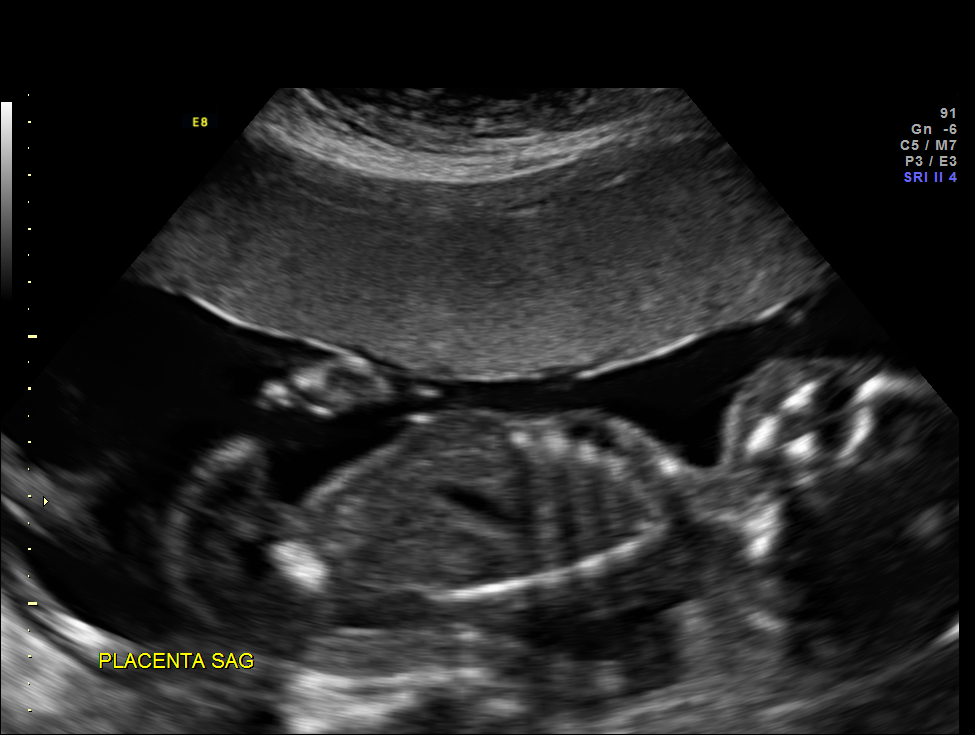
[im 5/7]
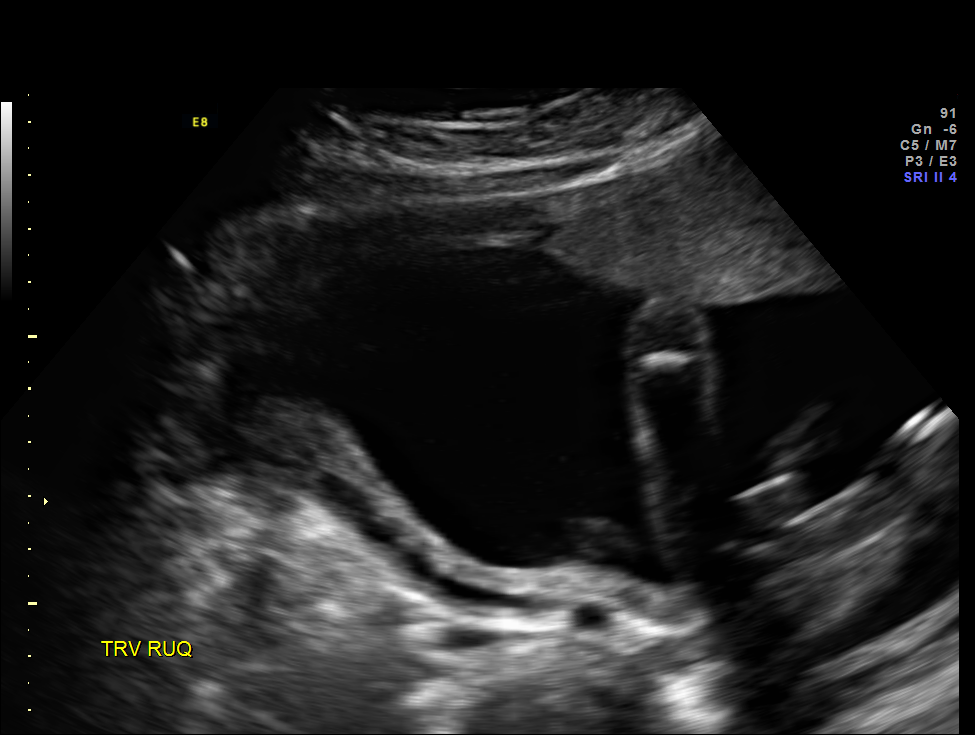
[im 6/7]
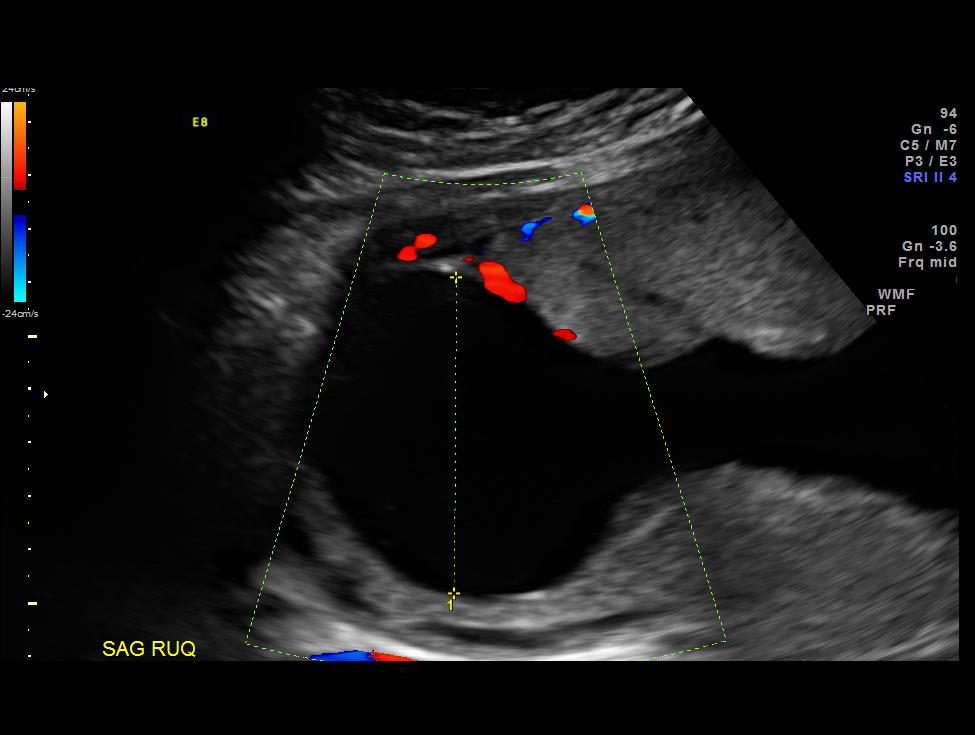
[im 7/7]
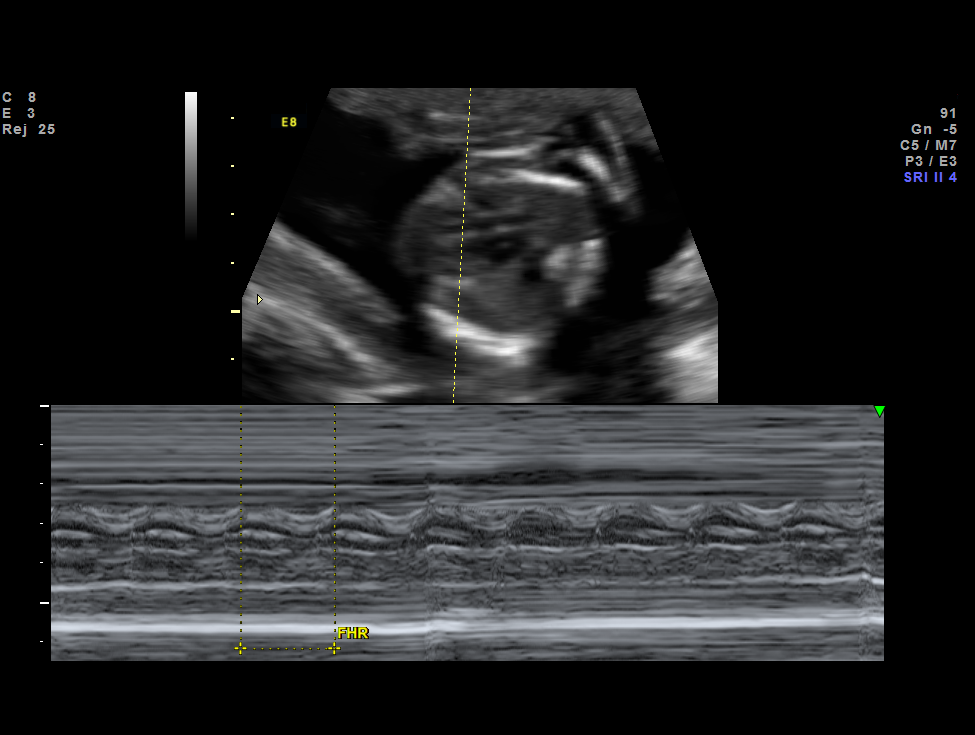

[7 of 7 positions shown; findings below may reference images not displayed]

OBSTETRICS REPORT
                      (Signed Final 07/17/2013 [DATE])

Service(s) Provided

 US AMNIOCENTESIS                                      76946.0
Indications

 Abnormal biochemical screen - Positive NIPS for
 T21
 Abnormal biochemical screen (quad) for Trisomy
 21 ([DATE])
 Diabetes - Pregestational; type 1 on insulin pump
Fetal Evaluation

 Num Of Fetuses:    1
 Fetal Heart Rate:  142                          bpm
 Cardiac Activity:  Observed
 Presentation:      Cephalic
 Placenta:          Anterior, above cervical os
 P. Cord            Previously Visualized
 Insertion:

 Amniotic Fluid
 AFI FV:      Subjectively within normal limits
                                             Larg Pckt:     5.9  cm
Gestational Age

 LMP:           20w 1d        Date:  02/26/13                 EDD:   12/03/13
 Best:          20w 1d     Det. By:  LMP  (02/26/13)          EDD:   12/03/13
Guided Procedures

 Type:   Amniocentesis for fetal genetic study

 FH Post Procedure:     Normal             RH Type:          AB+
 Rh Immune Globulin:    Not required,      Discharge Inst.:  Post-procedure
                        Rh positive                          instructions
                                                             given
 Needle Insertions:     22 gauge x 1       Vol. Withdrawn:   20 ml of clear
                                                             amniotic fluid
 Transabdominal:        Yes

 Complications:  None

 Comment:                    Amniocentesis performed after obtaining informed consent and performing  a "time-out".  No
                             complications.  Fetal heart activity documented post procedure.
Impression

 Single IUP at 20 [DATE] weeks
 Limited ultrasound performed for amniocentesis guidance
 Cell free fetal DNA positive for Trisomy 21

 After informed consent was obtained, amniocentsis was
 performed without complications, single pass.  Fetal heart
 activity documented post procedure.
 Amniotic fluid sent for FISH and karyotype.
Recommendations

 Await results of amniocentesis.
 If positive for Trisomy 21, would recommend fetal echo with
 Peds cardiology and serial growth scans.
